# Patient Record
Sex: Female | Born: 1940 | Race: Black or African American | Hispanic: No | Marital: Married | Smoking: Never smoker
Health system: Southern US, Community
[De-identification: ages and names within clinical notes are randomized; demographics above are authoritative.]

## PROBLEM LIST (undated history)

## (undated) DIAGNOSIS — Z923 Personal history of irradiation: Secondary | ICD-10-CM

## (undated) DIAGNOSIS — M1990 Unspecified osteoarthritis, unspecified site: Secondary | ICD-10-CM

## (undated) DIAGNOSIS — C50919 Malignant neoplasm of unspecified site of unspecified female breast: Secondary | ICD-10-CM

## (undated) DIAGNOSIS — D0510 Intraductal carcinoma in situ of unspecified breast: Secondary | ICD-10-CM

## (undated) HISTORY — DX: Unspecified osteoarthritis, unspecified site: M19.90

## (undated) HISTORY — PX: ROTATOR CUFF REPAIR: SHX139

## (undated) HISTORY — PX: CERVICAL LAMINECTOMY: SHX94

## (undated) HISTORY — PX: BUNIONECTOMY: SHX129

## (undated) HISTORY — DX: Intraductal carcinoma in situ of unspecified breast: D05.10

---

## 2009-11-25 ENCOUNTER — Emergency Department: Payer: Self-pay | Admitting: Emergency Medicine

## 2010-12-21 DIAGNOSIS — C50919 Malignant neoplasm of unspecified site of unspecified female breast: Secondary | ICD-10-CM

## 2010-12-21 HISTORY — PX: BREAST LUMPECTOMY: SHX2

## 2010-12-21 HISTORY — PX: BREAST BIOPSY: SHX20

## 2010-12-21 HISTORY — DX: Malignant neoplasm of unspecified site of unspecified female breast: C50.919

## 2010-12-22 ENCOUNTER — Emergency Department: Payer: Self-pay | Admitting: Emergency Medicine

## 2011-05-07 ENCOUNTER — Ambulatory Visit: Payer: Self-pay | Admitting: Orthopedic Surgery

## 2011-09-24 ENCOUNTER — Ambulatory Visit: Payer: Self-pay | Admitting: Surgery

## 2011-10-13 ENCOUNTER — Other Ambulatory Visit: Payer: Self-pay | Admitting: General Surgery

## 2011-10-13 DIAGNOSIS — R928 Other abnormal and inconclusive findings on diagnostic imaging of breast: Secondary | ICD-10-CM

## 2011-10-13 DIAGNOSIS — N63 Unspecified lump in unspecified breast: Secondary | ICD-10-CM

## 2011-10-13 DIAGNOSIS — N6489 Other specified disorders of breast: Secondary | ICD-10-CM

## 2011-10-28 ENCOUNTER — Ambulatory Visit
Admission: RE | Admit: 2011-10-28 | Discharge: 2011-10-28 | Disposition: A | Discharge status: Home / Self Care | Payer: Medicare Other | Source: Ambulatory Visit | Attending: General Surgery | Admitting: General Surgery

## 2011-10-28 DIAGNOSIS — N6489 Other specified disorders of breast: Secondary | ICD-10-CM

## 2011-10-28 DIAGNOSIS — R928 Other abnormal and inconclusive findings on diagnostic imaging of breast: Secondary | ICD-10-CM

## 2011-10-28 DIAGNOSIS — N63 Unspecified lump in unspecified breast: Secondary | ICD-10-CM

## 2011-10-28 MED ORDER — GADOBENATE DIMEGLUMINE 529 MG/ML IV SOLN
15.0000 mL | Freq: Once | INTRAVENOUS | Status: AC | PRN
  Administered 2011-10-28: 15 mL via INTRAVENOUS

## 2011-10-30 ENCOUNTER — Other Ambulatory Visit: Payer: Self-pay | Admitting: General Surgery

## 2011-10-30 DIAGNOSIS — R928 Other abnormal and inconclusive findings on diagnostic imaging of breast: Secondary | ICD-10-CM

## 2011-11-06 ENCOUNTER — Ambulatory Visit
Admission: RE | Admit: 2011-11-06 | Discharge: 2011-11-06 | Disposition: A | Discharge status: Home / Self Care | Payer: Medicare Other | Source: Ambulatory Visit | Attending: General Surgery | Admitting: General Surgery

## 2011-11-06 ENCOUNTER — Other Ambulatory Visit: Payer: Self-pay | Admitting: General Surgery

## 2011-11-06 DIAGNOSIS — R928 Other abnormal and inconclusive findings on diagnostic imaging of breast: Secondary | ICD-10-CM

## 2011-11-06 MED ORDER — GADOBENATE DIMEGLUMINE 529 MG/ML IV SOLN
15.0000 mL | Freq: Once | INTRAVENOUS | Status: AC | PRN
  Administered 2011-11-06: 15 mL via INTRAVENOUS

## 2011-11-10 ENCOUNTER — Ambulatory Visit
Admission: RE | Admit: 2011-11-10 | Discharge: 2011-11-10 | Disposition: A | Discharge status: Home / Self Care | Payer: Medicare Other | Source: Ambulatory Visit | Attending: General Surgery | Admitting: General Surgery

## 2011-11-10 DIAGNOSIS — R928 Other abnormal and inconclusive findings on diagnostic imaging of breast: Secondary | ICD-10-CM

## 2011-11-17 ENCOUNTER — Ambulatory Visit: Payer: Self-pay | Admitting: Surgery

## 2011-11-17 DIAGNOSIS — Z01810 Encounter for preprocedural cardiovascular examination: Secondary | ICD-10-CM

## 2011-11-24 ENCOUNTER — Ambulatory Visit: Payer: Self-pay | Admitting: Surgery

## 2011-12-11 ENCOUNTER — Ambulatory Visit: Payer: Self-pay | Admitting: Oncology

## 2011-12-22 ENCOUNTER — Ambulatory Visit: Payer: Self-pay | Admitting: Oncology

## 2012-01-01 LAB — CBC CANCER CENTER
Eosinophil #: 0.1 x10 3/mm (ref 0.0–0.7)
Eosinophil %: 1.2 %
MCH: 29.5 pg (ref 26.0–34.0)
MCHC: 33.6 g/dL (ref 32.0–36.0)
Monocyte #: 0.4 x10 3/mm (ref 0.0–0.7)
Neutrophil %: 37.9 %
Platelet: 252 x10 3/mm (ref 150–440)
RBC: 4.55 10*6/uL (ref 3.80–5.20)

## 2012-01-15 LAB — CBC CANCER CENTER
Basophil %: 0.8 %
Eosinophil #: 0.1 x10 3/mm (ref 0.0–0.7)
Lymphocyte %: 45.5 %
MCHC: 33.6 g/dL (ref 32.0–36.0)
Monocyte #: 0.4 x10 3/mm (ref 0.0–0.7)
Monocyte %: 11.7 %
Neutrophil #: 1.2 x10 3/mm — ABNORMAL LOW (ref 1.4–6.5)
Neutrophil %: 38.3 %
Platelet: 210 x10 3/mm (ref 150–440)
RDW: 14.5 % (ref 11.5–14.5)
WBC: 3.2 x10 3/mm — ABNORMAL LOW (ref 3.6–11.0)

## 2012-01-22 ENCOUNTER — Ambulatory Visit: Payer: Self-pay | Admitting: Oncology

## 2012-01-22 LAB — CBC CANCER CENTER
Basophil #: 0 x10 3/mm (ref 0.0–0.1)
Eosinophil #: 0 x10 3/mm (ref 0.0–0.7)
Eosinophil %: 1.6 %
HCT: 38.4 % (ref 35.0–47.0)
MCH: 29.8 pg (ref 26.0–34.0)
MCV: 88 fL (ref 80–100)
Monocyte %: 10.6 %
Neutrophil #: 1.5 x10 3/mm (ref 1.4–6.5)
RBC: 4.36 10*6/uL (ref 3.80–5.20)
WBC: 2.9 x10 3/mm — ABNORMAL LOW (ref 3.6–11.0)

## 2012-01-29 LAB — CBC CANCER CENTER
Eosinophil #: 0.1 x10 3/mm (ref 0.0–0.7)
HCT: 39.4 % (ref 35.0–47.0)
Lymphocyte %: 35.7 %
Monocyte #: 0.4 x10 3/mm (ref 0.0–0.7)
Monocyte %: 12.6 %
Neutrophil #: 1.5 x10 3/mm (ref 1.4–6.5)
Platelet: 194 x10 3/mm (ref 150–440)
RBC: 4.48 10*6/uL (ref 3.80–5.20)
RDW: 14.2 % (ref 11.5–14.5)
WBC: 3.1 x10 3/mm — ABNORMAL LOW (ref 3.6–11.0)

## 2012-02-12 LAB — CBC CANCER CENTER
Basophil %: 0.7 %
Eosinophil %: 1.4 %
HGB: 13.2 g/dL (ref 12.0–16.0)
Lymphocyte #: 1 x10 3/mm (ref 1.0–3.6)
Monocyte #: 0.4 x10 3/mm (ref 0.0–0.7)
Monocyte %: 14.2 %
Platelet: 187 x10 3/mm (ref 150–440)
WBC: 2.8 x10 3/mm — ABNORMAL LOW (ref 3.6–11.0)

## 2012-02-19 ENCOUNTER — Ambulatory Visit: Payer: Self-pay | Admitting: Oncology

## 2012-03-21 ENCOUNTER — Ambulatory Visit: Payer: Self-pay | Admitting: Oncology

## 2012-04-20 ENCOUNTER — Ambulatory Visit: Payer: Self-pay | Admitting: Oncology

## 2012-06-28 ENCOUNTER — Ambulatory Visit: Payer: Self-pay | Admitting: Oncology

## 2012-07-21 ENCOUNTER — Ambulatory Visit: Payer: Self-pay | Admitting: Oncology

## 2012-09-23 ENCOUNTER — Ambulatory Visit: Payer: Self-pay | Admitting: Oncology

## 2012-09-27 ENCOUNTER — Ambulatory Visit: Payer: Self-pay | Admitting: Oncology

## 2012-10-21 ENCOUNTER — Ambulatory Visit: Payer: Self-pay | Admitting: Oncology

## 2012-12-21 ENCOUNTER — Ambulatory Visit: Payer: Self-pay | Admitting: Oncology

## 2013-01-03 LAB — CBC CANCER CENTER
Basophil %: 1 %
Eosinophil #: 0.1 x10 3/mm (ref 0.0–0.7)
HGB: 12.9 g/dL (ref 12.0–16.0)
MCH: 29.6 pg (ref 26.0–34.0)
Monocyte #: 0.4 x10 3/mm (ref 0.2–0.9)
Neutrophil #: 1.2 x10 3/mm — ABNORMAL LOW (ref 1.4–6.5)
Platelet: 179 x10 3/mm (ref 150–440)
RDW: 14.3 % (ref 11.5–14.5)

## 2013-01-03 LAB — IRON AND TIBC: Unbound Iron-Bind.Cap.: 205 ug/dL

## 2013-01-03 LAB — FERRITIN: Ferritin (ARMC): 206 ng/mL (ref 8–388)

## 2013-01-21 ENCOUNTER — Ambulatory Visit: Payer: Self-pay | Admitting: Oncology

## 2013-06-20 ENCOUNTER — Ambulatory Visit: Payer: Self-pay | Admitting: Oncology

## 2013-07-21 ENCOUNTER — Ambulatory Visit: Payer: Self-pay | Admitting: Oncology

## 2013-11-02 ENCOUNTER — Ambulatory Visit: Payer: Self-pay | Admitting: Oncology

## 2014-02-28 ENCOUNTER — Ambulatory Visit: Payer: Self-pay | Admitting: Oncology

## 2014-03-21 ENCOUNTER — Ambulatory Visit: Payer: Self-pay | Admitting: Oncology

## 2014-12-24 DIAGNOSIS — M5030 Other cervical disc degeneration, unspecified cervical region: Secondary | ICD-10-CM | POA: Insufficient documentation

## 2015-01-14 ENCOUNTER — Ambulatory Visit: Payer: Self-pay | Admitting: Oncology

## 2015-09-20 ENCOUNTER — Telehealth: Payer: Self-pay | Admitting: *Deleted

## 2015-09-20 NOTE — Telephone Encounter (Signed)
Having pain in right breast where she had surgery in 2012. Patient was a no show for her appt in Sept 2015 and has no fu appt scheduled I called and left message that appt has been scheduled for 10/11 at 315 and to call back if unable to keep that appt

## 2015-10-01 ENCOUNTER — Encounter: Payer: Self-pay | Admitting: Oncology

## 2015-10-01 ENCOUNTER — Inpatient Hospital Stay: Payer: Medicare Other | Attending: Oncology | Admitting: Oncology

## 2015-10-01 VITALS — BP 170/79 | HR 76 | Temp 96.5°F | Resp 18 | Wt 175.0 lb

## 2015-10-01 DIAGNOSIS — Z803 Family history of malignant neoplasm of breast: Secondary | ICD-10-CM | POA: Diagnosis not present

## 2015-10-01 DIAGNOSIS — Z79899 Other long term (current) drug therapy: Secondary | ICD-10-CM

## 2015-10-01 DIAGNOSIS — N644 Mastodynia: Secondary | ICD-10-CM | POA: Diagnosis not present

## 2015-10-01 DIAGNOSIS — Z79810 Long term (current) use of selective estrogen receptor modulators (SERMs): Secondary | ICD-10-CM

## 2015-10-01 DIAGNOSIS — D0511 Intraductal carcinoma in situ of right breast: Secondary | ICD-10-CM | POA: Insufficient documentation

## 2015-10-01 DIAGNOSIS — M1990 Unspecified osteoarthritis, unspecified site: Secondary | ICD-10-CM | POA: Diagnosis not present

## 2015-10-01 MED ORDER — TAMOXIFEN CITRATE 20 MG PO TABS: 20.0000 mg | ORAL_TABLET | Freq: Every day | ORAL | Status: DC

## 2015-10-01 NOTE — Progress Notes (Signed)
For the past month patient has occasional right breast pain with stretching or reaching with her right arm.

## 2015-10-15 NOTE — Progress Notes (Signed)
[place]  Telephone:(336) [phone] Fax:(336) [phone]  ID: ODESSA NISHI OB: 1941/10/30  MR#: [id number]  OQH#:[id number]  No care team member to display  CHIEF COMPLAINT:  Chief Complaint  Patient presents with  . DCIS    INTERVAL HISTORY: Patient returns to clinic today for further evaluation. Patient is complaining of worsening right breast pain particularly with stretching or reaching with her right arm. She continues to tolerate tamoxifen well without significant side effects. She has no recent fevers or illnesses.  She has no neurologic complaints.  She denies any other pain.  She has a good appetite and denies weight loss.  She denies any chest pain or shortness of breath.  She has no nausea, vomiting, constipation, or diarrhea.  She has no urinary complaints.  Patient offers no further specific complaints today.   REVIEW OF SYSTEMS:   Review of Systems  Constitutional: Negative.   Respiratory: Negative.   Cardiovascular: Negative.   Gastrointestinal: Negative.   Musculoskeletal:       Right breast pain.  Neurological: Negative.     As per HPI. Otherwise, a complete review of systems is negatve.  PAST MEDICAL HISTORY: Past Medical History  Diagnosis Date  . DCIS (ductal carcinoma in situ)   . Osteoarthritis     PAST SURGICAL HISTORY: Past Surgical History  Procedure Laterality Date  . Cervical laminectomy    . Rotator cuff repair    . Bunionectomy    . Breast lumpectomy      FAMILY HISTORY Family History  Problem Relation Age of Onset  . Breast cancer Mother 78  . Coronary artery disease Mother   . Hypertension Mother        ADVANCED DIRECTIVES:    HEALTH MAINTENANCE: Social History  Substance Use Topics  . Smoking status: Not on file  . Smokeless tobacco: Not on file  . Alcohol Use: Not on file     Colonoscopy:  PAP:  Bone density:  Lipid panel:  Allergies  Allergen Reactions  . Quinine Hives    Current  Outpatient Prescriptions  Medication Sig Dispense Refill  . Biotin 1000 MCG tablet Take by mouth.    . calcium carbonate (TUMS - DOSED IN MG ELEMENTAL CALCIUM) 500 MG chewable tablet Chew by mouth.    . CO ENZYME Q-10 PO Take by mouth.    . Cyanocobalamin (VITAMIN B 12 PO) Take by mouth.    . latanoprost (XALATAN) 0.005 % ophthalmic solution Place 1 drop into both eyes at bedtime.  5  . Red Yeast Rice 600 MG TABS Take by mouth.    Marland Kitchen SIMBRINZA 1-0.2 % SUSP INSTILL 1 DROP INTO BOTH EYES TWICE A DAY  5  . tamoxifen (NOLVADEX) 20 MG tablet Take 1 tablet (20 mg total) by mouth daily. 30 tablet 11  . vitamin C (ASCORBIC ACID) 500 MG tablet Take by mouth.     No current facility-administered medications for this visit.    OBJECTIVE: Filed Vitals:   10/01/15 1544  BP: 170/79  Pulse: 76  Temp: 96.5 F (35.8 C)  Resp: 18     There is no height on file to calculate BMI.    ECOG FS:0 - Asymptomatic  General: Well-developed, well-nourished, no acute distress. Eyes: Pink conjunctiva, anicteric sclera. Breasts: Bilateral breast and axilla without lumps or masses. Right breast with well-healed surgical scar. Lungs: Clear to auscultation bilaterally. Heart: Regular rate and rhythm. No rubs, murmurs, or gallops. Abdomen: Soft, nontender, nondistended. No organomegaly noted,  normoactive bowel sounds. Musculoskeletal: No edema, cyanosis, or clubbing. Neuro: Alert, answering all questions appropriately. Cranial nerves grossly intact. Skin: No rashes or petechiae noted. Psych: Normal affect.   LAB RESULTS:  No results found for: NA, K, CL, CO2, GLUCOSE, BUN, CREATININE, CALCIUM, PROT, ALBUMIN, AST, ALT, ALKPHOS, BILITOT, GFRNONAA, GFRAA  Lab Results  Component Value Date   WBC 2.9* 01/03/2013   NEUTROABS 1.2* 01/03/2013   HGB 12.9 01/03/2013   HCT 38.4 01/03/2013   MCV 88 01/03/2013   PLT 179 01/03/2013     STUDIES: No results found.  ASSESSMENT: DCIS  PLAN:    1.  DCIS: No  evidence of disease.  Her most recent mammogram on January 14, 2015 was reported as BI-RADS 2.  Repeat in January 2017  Continue tamoxifen completing in May 2018.  Return to clinic in 6 months for routine evaluation.  2. Breast pain: Unclear etiology. No distinct masses or pathology palpated. Possibly related to underlying scar tissue. Monitor.  Patient expressed understanding and was in agreement with this plan. She also understands that She can call clinic at any time with any questions, concerns, or complaints.    Lloyd Huger, MD   10/15/2015 11:04 PM

## 2016-01-16 ENCOUNTER — Other Ambulatory Visit: Payer: Medicare Other

## 2016-01-16 ENCOUNTER — Ambulatory Visit: Payer: Medicare Other

## 2016-01-29 ENCOUNTER — Other Ambulatory Visit: Payer: Self-pay | Admitting: Oncology

## 2016-01-29 ENCOUNTER — Ambulatory Visit
Admission: RE | Admit: 2016-01-29 | Discharge: 2016-01-29 | Disposition: A | Discharge status: Home / Self Care | Payer: Medicare Other | Source: Ambulatory Visit | Attending: Oncology | Admitting: Oncology

## 2016-01-29 DIAGNOSIS — D0511 Intraductal carcinoma in situ of right breast: Secondary | ICD-10-CM

## 2016-01-29 HISTORY — DX: Malignant neoplasm of unspecified site of unspecified female breast: C50.919

## 2016-01-29 NOTE — Progress Notes (Signed)
Rapid Response called for patient. Patient feeling clammy and diaphoretic. Pt was laid down by tech and legs elevated and pt was drinking diet ginger ale. Pt was feeling better at the time this RN arrived. BP was 120/70's and HR in the 70's. Blood sugar was 83 after she had already drank some ginger ale. Pt stated she hadn't had much to eat today. Pt advised to continue to drink fluids and try to eat something to maintain blood sugar. Pt BP prior to this RN leaving was 152/72 and HR 71. Crewe Heathman E 3:12 PM 01/29/2016

## 2016-03-25 ENCOUNTER — Ambulatory Visit: Admission: RE | Admit: 2016-03-25 | Payer: Medicare Other | Source: Ambulatory Visit

## 2016-03-25 ENCOUNTER — Ambulatory Visit
Admission: RE | Admit: 2016-03-25 | Discharge: 2016-03-25 | Disposition: A | Discharge status: Home / Self Care | Payer: Medicare Other | Source: Ambulatory Visit | Attending: Oncology | Admitting: Oncology

## 2016-03-25 DIAGNOSIS — Z853 Personal history of malignant neoplasm of breast: Secondary | ICD-10-CM | POA: Insufficient documentation

## 2016-04-07 ENCOUNTER — Inpatient Hospital Stay: Payer: Medicare Other | Admitting: Oncology

## 2016-04-08 ENCOUNTER — Inpatient Hospital Stay: Payer: Medicare Other | Attending: Oncology | Admitting: Oncology

## 2016-04-08 VITALS — BP 155/84 | HR 99 | Temp 96.0°F | Resp 18 | Wt 173.7 lb

## 2016-04-08 DIAGNOSIS — D0511 Intraductal carcinoma in situ of right breast: Secondary | ICD-10-CM | POA: Insufficient documentation

## 2016-04-08 DIAGNOSIS — Z803 Family history of malignant neoplasm of breast: Secondary | ICD-10-CM | POA: Insufficient documentation

## 2016-04-08 DIAGNOSIS — M1990 Unspecified osteoarthritis, unspecified site: Secondary | ICD-10-CM | POA: Insufficient documentation

## 2016-04-08 DIAGNOSIS — I10 Essential (primary) hypertension: Secondary | ICD-10-CM | POA: Diagnosis not present

## 2016-04-08 DIAGNOSIS — Z170 Estrogen receptor positive status [ER+]: Secondary | ICD-10-CM | POA: Insufficient documentation

## 2016-04-08 DIAGNOSIS — R5381 Other malaise: Secondary | ICD-10-CM | POA: Insufficient documentation

## 2016-04-08 DIAGNOSIS — R5383 Other fatigue: Secondary | ICD-10-CM | POA: Insufficient documentation

## 2016-04-08 DIAGNOSIS — Z79810 Long term (current) use of selective estrogen receptor modulators (SERMs): Secondary | ICD-10-CM | POA: Insufficient documentation

## 2016-04-08 DIAGNOSIS — Z79899 Other long term (current) drug therapy: Secondary | ICD-10-CM | POA: Insufficient documentation

## 2016-04-08 NOTE — Progress Notes (Signed)
Patient feels she gets fatigued more easily.

## 2016-04-08 NOTE — Progress Notes (Signed)
[place]  Telephone:(336) [phone] Fax:(336) [phone]  ID: Olivia Burton OB: 1941-09-27  MR#: [id number]  [id number]  No care team member to display  CHIEF COMPLAINT:  Chief Complaint  Patient presents with  . DCIS    INTERVAL HISTORY: Patient returns to clinic today for further evaluation. Patient feels well today. Her only complaint is fatigue which she attributes to 'taking on too much responsibility at church".  She continues to tolerate tamoxifen well without significant side effects. She has no recent fevers or illnesses.  She has no neurologic complaints.  She denies any other pain.  She has a good appetite and denies weight loss.  She denies any chest pain or shortness of breath.  She has no nausea, vomiting, constipation, or diarrhea.  She has no urinary complaints.  Patient offers no further specific complaints today.   REVIEW OF SYSTEMS:   Review of Systems  Constitutional: Positive for malaise/fatigue.  Respiratory: Negative.   Cardiovascular: Negative.   Gastrointestinal: Negative.   Musculoskeletal:       Right breast pain.  Neurological: Negative.     As per HPI. Otherwise, a complete review of systems is negatve.  PAST MEDICAL HISTORY: Past Medical History  Diagnosis Date  . DCIS (ductal carcinoma in situ)   . Osteoarthritis   . Breast cancer [facility]) 2012    Right breast    PAST SURGICAL HISTORY: Past Surgical History  Procedure Laterality Date  . Cervical laminectomy    . Rotator cuff repair    . Bunionectomy    . Breast lumpectomy Right 2012    FAMILY HISTORY Family History  Problem Relation Age of Onset  . Breast cancer Mother 34  . Coronary artery disease Mother   . Hypertension Mother        ADVANCED DIRECTIVES:    HEALTH MAINTENANCE: Social History  Substance Use Topics  . Smoking status: Not on file  . Smokeless tobacco: Not on file  . Alcohol Use: Not on file    Allergies  Allergen Reactions  .  Quinine Hives    Current Outpatient Prescriptions  Medication Sig Dispense Refill  . Biotin 1000 MCG tablet Take by mouth.    . calcium carbonate (TUMS - DOSED IN MG ELEMENTAL CALCIUM) 500 MG chewable tablet Chew by mouth.    . CO ENZYME Q-10 PO Take by mouth.    . Cyanocobalamin (VITAMIN B 12 PO) Take by mouth.    . latanoprost (XALATAN) 0.005 % ophthalmic solution Place 1 drop into both eyes at bedtime.  5  . Red Yeast Rice 600 MG TABS Take by mouth.    Marland Kitchen SIMBRINZA 1-0.2 % SUSP INSTILL 1 DROP INTO BOTH EYES TWICE A DAY  5  . tamoxifen (NOLVADEX) 20 MG tablet Take 1 tablet (20 mg total) by mouth daily. 30 tablet 11  . vitamin C (ASCORBIC ACID) 500 MG tablet Take by mouth.     No current facility-administered medications for this visit.    OBJECTIVE: Filed Vitals:   04/08/16 1005  BP: 155/84  Pulse: 99  Temp: 96 F (35.6 C)  Resp: 18     There is no height on file to calculate BMI.    ECOG FS:0 - Asymptomatic  General: Well-developed, well-nourished, no acute distress. Eyes: Pink conjunctiva, anicteric sclera. Breasts: Bilateral breast and axilla without lumps or masses. Right breast with well-healed surgical scar. Lungs: Clear to auscultation bilaterally. Heart: Regular rate and rhythm. No rubs, murmurs, or gallops. Abdomen: Soft,  nontender, nondistended. No organomegaly noted, normoactive bowel sounds. Musculoskeletal: No edema, cyanosis, or clubbing. Neuro: Alert, answering all questions appropriately. Cranial nerves grossly intact. Skin: No rashes or petechiae noted. Psych: Normal affect.   LAB RESULTS:  No results found for: NA, K, CL, CO2, GLUCOSE, BUN, CREATININE, CALCIUM, PROT, ALBUMIN, AST, ALT, ALKPHOS, BILITOT, GFRNONAA, GFRAA  Lab Results  Component Value Date   WBC 2.9* 01/03/2013   NEUTROABS 1.2* 01/03/2013   HGB 12.9 01/03/2013   HCT 38.4 01/03/2013   MCV 88 01/03/2013   PLT 179 01/03/2013     STUDIES: Mm Diag Breast Tomo Bilateral  03/25/2016   CLINICAL DATA:  Personal history of right breast cancer status post lumpectomy 2012 EXAM: 2D DIGITAL DIAGNOSTIC BILATERAL MAMMOGRAM WITH CAD AND ADJUNCT TOMO COMPARISON:  Previous exam(s). ACR Breast Density Category b: There are scattered areas of fibroglandular density. FINDINGS: Cc and MLO views of bilateral breasts, spot tangential view of right breast are submitted. Postsurgical changes are identified in the right breast. No suspicious abnormalities identified bilaterally. Mammographic images were processed with CAD. IMPRESSION: Benign findings. RECOMMENDATION: Bilateral diagnostic mammogram in 1 year. I have discussed the findings and recommendations with the patient. Results were also provided in writing at the conclusion of the visit. If applicable, a reminder letter will be sent to the patient regarding the next appointment. BI-RADS CATEGORY  2: Benign. Electronically Signed   By: Abelardo Diesel M.D.   On: 03/25/2016 14:54    ASSESSMENT: DCIS  PLAN:    1.  DCIS: No evidence of disease.  Her most recent mammogram on March 25, 2016 was reported as BI-RADS 2.  Repeat in April 2018  Continue tamoxifen completing in May 2018.  Return to clinic in 6 months for routine evaluation.  2. Hypertension: Patient's blood pressure is mildly elevated today. Will defer treatment per primary care.   Patient expressed understanding and was in agreement with this plan. She also understands that She can call clinic at any time with any questions, concerns, or complaints.    Mayra Reel, NP   04/08/2016 10:53 AM  Patient was seen and evaluated independently and I agree with the assessment and plan as dictated above.  Lloyd Huger, MD 04/08/2016 4:45 PM

## 2016-10-07 DIAGNOSIS — D0511 Intraductal carcinoma in situ of right breast: Secondary | ICD-10-CM | POA: Insufficient documentation

## 2016-10-07 NOTE — Progress Notes (Signed)
[place]  Telephone:(336) [phone] Fax:(336) [phone]  ID: Olivia Burton OB: [birth year]  MR#: [id number]  [id number]  No care team member to display  CHIEF COMPLAINT: DCIS of the right breast.  INTERVAL HISTORY: Patient returns to clinic today for routine six-month evaluation. She currently feels well and is asymptomatic. She continues to tolerate tamoxifen well without significant side effects. She has no recent fevers or illnesses. She has no neurologic complaints. She denies any pain. She has a good appetite and denies weight loss.  She denies any chest pain or shortness of breath.  She has no nausea, vomiting, constipation, or diarrhea.  She has no urinary complaints.  Patient offers no specific complaints today.   REVIEW OF SYSTEMS:   Review of Systems  Constitutional: Negative for fever, malaise/fatigue and weight loss.  Respiratory: Negative.  Negative for cough and shortness of breath.   Cardiovascular: Negative.  Negative for chest pain and leg swelling.  Gastrointestinal: Negative.   Genitourinary: Negative.   Musculoskeletal: Negative.   Neurological: Negative.  Negative for sensory change and weakness.  Psychiatric/Behavioral: Negative.  The patient is not nervous/anxious.     As per HPI. Otherwise, a complete review of systems is negative.  PAST MEDICAL HISTORY: Past Medical History:  Diagnosis Date  . Breast cancer ([place]) 2012   Right breast  . DCIS (ductal carcinoma in situ)   . Osteoarthritis     PAST SURGICAL HISTORY: Past Surgical History:  Procedure Laterality Date  . BREAST LUMPECTOMY Right 2012  . BUNIONECTOMY    . CERVICAL LAMINECTOMY    . ROTATOR CUFF REPAIR      FAMILY HISTORY Family History  Problem Relation Age of Onset  . Breast cancer Mother 68  . Coronary artery disease Mother   . Hypertension Mother        ADVANCED DIRECTIVES:    HEALTH MAINTENANCE: Social History  Substance Use Topics  . Smoking  status: Not on file  . Smokeless tobacco: Not on file  . Alcohol use Not on file    Allergies  Allergen Reactions  . Quinine Hives    Current Outpatient Prescriptions  Medication Sig Dispense Refill  . Biotin 1000 MCG tablet Take by mouth.    . calcium carbonate (TUMS - DOSED IN MG ELEMENTAL CALCIUM) 500 MG chewable tablet Chew by mouth.    . CO ENZYME Q-10 PO Take by mouth.    . Cyanocobalamin (VITAMIN B 12 PO) Take by mouth.    . latanoprost (XALATAN) 0.005 % ophthalmic solution Place 1 drop into both eyes at bedtime.  5  . Red Yeast Rice 600 MG TABS Take by mouth.    Marland Kitchen SIMBRINZA 1-0.2 % SUSP INSTILL 1 DROP INTO BOTH EYES TWICE A DAY  5  . tamoxifen (NOLVADEX) 20 MG tablet Take 1 tablet (20 mg total) by mouth daily. 30 tablet 11  . vitamin C (ASCORBIC ACID) 500 MG tablet Take by mouth.     No current facility-administered medications for this visit.     OBJECTIVE: Vitals:   10/08/16 1059  BP: 138/81  Pulse: 82  Resp: 18  Temp: 98.7 F (37.1 C)     There is no height or weight on file to calculate BMI.    ECOG FS:0 - Asymptomatic  General: Well-developed, well-nourished, no acute distress. Eyes: Pink conjunctiva, anicteric sclera. Breasts: Bilateral breast and axilla without lumps or masses. Right breast with well-healed surgical scar. Lungs: Clear to auscultation bilaterally. Heart: Regular rate and  rhythm. No rubs, murmurs, or gallops. Abdomen: Soft, nontender, nondistended. No organomegaly noted, normoactive bowel sounds. Musculoskeletal: No edema, cyanosis, or clubbing. Neuro: Alert, answering all questions appropriately. Cranial nerves grossly intact. Skin: No rashes or petechiae noted. Psych: Normal affect.   LAB RESULTS:  No results found for: NA, K, CL, CO2, GLUCOSE, BUN, CREATININE, CALCIUM, PROT, ALBUMIN, AST, ALT, ALKPHOS, BILITOT, GFRNONAA, GFRAA  Lab Results  Component Value Date   WBC 2.9 (L) 01/03/2013   NEUTROABS 1.2 (L) 01/03/2013   HGB 12.9  01/03/2013   HCT 38.4 01/03/2013   MCV 88 01/03/2013   PLT 179 01/03/2013     STUDIES: No results found.  ASSESSMENT: DCIS of the right breast  PLAN:    1. DCIS of the right breast: No evidence of disease.  Her most recent mammogram on March 25, 2016 was reported as BI-RADS 2.  Repeat in April 2018  Continue tamoxifen for 5 years completing in May 2018.  Return to clinic in 6 months for routine evaluation.  2. Hypertension: Improved. Continue evaluation and treatment per primary care.   Patient expressed understanding and was in agreement with this plan. She also understands that She can call clinic at any time with any questions, concerns, or complaints.    Lloyd Huger, MD   10/15/2016 5:18 PM

## 2016-10-08 ENCOUNTER — Inpatient Hospital Stay: Payer: Medicare Other | Attending: Oncology | Admitting: Oncology

## 2016-10-08 VITALS — BP 138/81 | HR 82 | Temp 98.7°F | Resp 18 | Wt 172.8 lb

## 2016-10-08 DIAGNOSIS — I10 Essential (primary) hypertension: Secondary | ICD-10-CM | POA: Diagnosis not present

## 2016-10-08 DIAGNOSIS — Z803 Family history of malignant neoplasm of breast: Secondary | ICD-10-CM | POA: Diagnosis not present

## 2016-10-08 DIAGNOSIS — D0511 Intraductal carcinoma in situ of right breast: Secondary | ICD-10-CM

## 2016-10-08 DIAGNOSIS — M1990 Unspecified osteoarthritis, unspecified site: Secondary | ICD-10-CM | POA: Diagnosis not present

## 2016-10-08 DIAGNOSIS — Z79810 Long term (current) use of selective estrogen receptor modulators (SERMs): Secondary | ICD-10-CM

## 2016-10-08 DIAGNOSIS — Z79899 Other long term (current) drug therapy: Secondary | ICD-10-CM

## 2016-10-08 DIAGNOSIS — Z170 Estrogen receptor positive status [ER+]: Secondary | ICD-10-CM | POA: Diagnosis not present

## 2016-10-08 NOTE — Progress Notes (Signed)
States has occasional muscle cramps from tamoxifen. Occasional will hold tamoxifen due to muscle cramps but then will restart.

## 2017-01-29 DIAGNOSIS — I4891 Unspecified atrial fibrillation: Secondary | ICD-10-CM | POA: Insufficient documentation

## 2017-03-29 ENCOUNTER — Ambulatory Visit
Admission: RE | Admit: 2017-03-29 | Discharge: 2017-03-29 | Disposition: A | Discharge status: Home / Self Care | Payer: Medicare Other | Source: Ambulatory Visit | Attending: Oncology | Admitting: Oncology

## 2017-03-29 DIAGNOSIS — D0511 Intraductal carcinoma in situ of right breast: Secondary | ICD-10-CM | POA: Diagnosis not present

## 2017-03-29 DIAGNOSIS — Z0000 Encounter for general adult medical examination without abnormal findings: Secondary | ICD-10-CM | POA: Diagnosis not present

## 2017-03-29 HISTORY — DX: Personal history of irradiation: Z92.3

## 2017-04-05 ENCOUNTER — Inpatient Hospital Stay: Payer: Medicare Other | Admitting: Oncology

## 2017-04-05 NOTE — Progress Notes (Deleted)
[place] [facility]  Telephone:([phone] Fax:(336) [phone]  ID: Olivia Burton OB: 11/30/1941  MR#: [id number]  NID#:[id number]  No care team member to display  CHIEF COMPLAINT: DCIS of the right breast.  INTERVAL HISTORY: Patient returns to clinic today for routine six-month evaluation. She currently feels well and is asymptomatic. She continues to tolerate tamoxifen well without significant side effects. She has no recent fevers or illnesses. She has no neurologic complaints. She denies any pain. She has a good appetite and denies weight loss.  She denies any chest pain or shortness of breath.  She has no nausea, vomiting, constipation, or diarrhea.  She has no urinary complaints.  Patient offers no specific complaints today.   REVIEW OF SYSTEMS:   Review of Systems  Constitutional: Negative for fever, malaise/fatigue and weight loss.  Respiratory: Negative.  Negative for cough and shortness of breath.   Cardiovascular: Negative.  Negative for chest pain and leg swelling.  Gastrointestinal: Negative.   Genitourinary: Negative.   Musculoskeletal: Negative.   Neurological: Negative.  Negative for sensory change and weakness.  Psychiatric/Behavioral: Negative.  The patient is not nervous/anxious.     As per HPI. Otherwise, a complete review of systems is negative.  PAST MEDICAL HISTORY: Past Medical History:  Diagnosis Date  . Breast cancer ([place]) 2012   Right breast  . DCIS (ductal carcinoma in situ)   . Osteoarthritis   . Personal history of radiation therapy     PAST SURGICAL HISTORY: Past Surgical History:  Procedure Laterality Date  . BREAST BIOPSY Right 2012   +  . BREAST LUMPECTOMY Right 2012  . BUNIONECTOMY    . CERVICAL LAMINECTOMY    . ROTATOR CUFF REPAIR      FAMILY HISTORY Family History  Problem Relation Age of Onset  . Breast cancer Mother 53  . Coronary artery disease Mother   . Hypertension Mother        ADVANCED DIRECTIVES:     HEALTH MAINTENANCE: Social History  Substance Use Topics  . Smoking status: Not on file  . Smokeless tobacco: Not on file  . Alcohol use Not on file    Allergies  Allergen Reactions  . Quinine Hives    Current Outpatient Prescriptions  Medication Sig Dispense Refill  . Biotin 1000 MCG tablet Take by mouth.    . calcium carbonate (TUMS - DOSED IN MG ELEMENTAL CALCIUM) 500 MG chewable tablet Chew by mouth.    . CO ENZYME Q-10 PO Take by mouth.    . Cyanocobalamin (VITAMIN B 12 PO) Take by mouth.    . latanoprost (XALATAN) 0.005 % ophthalmic solution Place 1 drop into both eyes at bedtime.  5  . Red Yeast Rice 600 MG TABS Take by mouth.    Marland Kitchen SIMBRINZA 1-0.2 % SUSP INSTILL 1 DROP INTO BOTH EYES TWICE A DAY  5  . tamoxifen (NOLVADEX) 20 MG tablet Take 1 tablet (20 mg total) by mouth daily. 30 tablet 11  . vitamin C (ASCORBIC ACID) 500 MG tablet Take by mouth.     No current facility-administered medications for this visit.     OBJECTIVE: There were no vitals filed for this visit.   There is no height or weight on file to calculate BMI.    ECOG FS:0 - Asymptomatic  General: Well-developed, well-nourished, no acute distress. Eyes: Pink conjunctiva, anicteric sclera. Breasts: Bilateral breast and axilla without lumps or masses. Right breast with well-healed surgical scar. Lungs: Clear to auscultation bilaterally. Heart:  Regular rate and rhythm. No rubs, murmurs, or gallops. Abdomen: Soft, nontender, nondistended. No organomegaly noted, normoactive bowel sounds. Musculoskeletal: No edema, cyanosis, or clubbing. Neuro: Alert, answering all questions appropriately. Cranial nerves grossly intact. Skin: No rashes or petechiae noted. Psych: Normal affect.   LAB RESULTS:  No results found for: NA, K, CL, CO2, GLUCOSE, BUN, CREATININE, CALCIUM, PROT, ALBUMIN, AST, ALT, ALKPHOS, BILITOT, GFRNONAA, GFRAA  Lab Results  Component Value Date   WBC 2.9 (L) 01/03/2013   NEUTROABS 1.2  (L) 01/03/2013   HGB 12.9 01/03/2013   HCT 38.4 01/03/2013   MCV 88 01/03/2013   PLT 179 01/03/2013     STUDIES: Mm Diag Breast Tomo Bilateral  Result Date: 03/29/2017 CLINICAL DATA:  Right lumpectomy.  Annual mammography. EXAM: 2D DIGITAL DIAGNOSTIC BILATERAL MAMMOGRAM WITH CAD AND ADJUNCT TOMO COMPARISON:  Previous exam(s). ACR Breast Density Category b: There are scattered areas of fibroglandular density. FINDINGS: The right lumpectomy site is stable. No suspicious masses, calcifications, or distortion Mammographic images were processed with CAD. IMPRESSION: No mammographic evidence of malignancy. RECOMMENDATION: Annual diagnostic mammography. I have discussed the findings and recommendations with the patient. Results were also provided in writing at the conclusion of the visit. If applicable, a reminder letter will be sent to the patient regarding the next appointment. BI-RADS CATEGORY  2: Benign. Electronically Signed   By: Dorise Bullion III M.D   On: 03/29/2017 11:44    ASSESSMENT: DCIS of the right breast  PLAN:    1. DCIS of the right breast: No evidence of disease.  Her most recent mammogram on March 25, 2016 was reported as BI-RADS 2.  Repeat in April 2018  Continue tamoxifen for 5 years completing in May 2018.  Return to clinic in 6 months for routine evaluation.  2. Hypertension: Improved. Continue evaluation and treatment per primary care.   Patient expressed understanding and was in agreement with this plan. She also understands that She can call clinic at any time with any questions, concerns, or complaints.    Lloyd Huger, MD   04/05/2017 12:00 AM

## 2017-04-13 NOTE — Progress Notes (Signed)
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  INTERVAL HISTORY: Patient returns to clinic today for routine six-month evaluation. She currently feels well and is asymptomatic. She admits to noncompliance to tamoxifen secondary to cramping and only has been taking it every couple days. She has no recent fevers or illnesses. She has no neurologic complaints. She denies any pain. She has a good appetite and denies weight loss.  She denies any chest pain or shortness of breath.  She has no nausea, vomiting, constipation, or diarrhea.  She has no urinary complaints.  Patient offers no specific complaints today.   REVIEW OF SYSTEMS:   Review of Systems  Constitutional: Negative for fever, malaise/fatigue and weight loss.  Respiratory: Negative.  Negative for cough and shortness of breath.   Cardiovascular: Negative.  Negative for chest pain and leg swelling.  Gastrointestinal: Negative.  Negative for abdominal pain.  Genitourinary: Negative.   Musculoskeletal: Negative.   Skin: Negative.  Negative for rash.  Neurological: Negative.  Negative for sensory change and weakness.  Psychiatric/Behavioral: Negative.  The patient is not nervous/anxious.     As per HPI. Otherwise, a complete review of systems is negative.  PAST MEDICAL HISTORY: Past Medical History:  Diagnosis Date  . Breast cancer ([place]) 2012   Right breast  . DCIS (ductal carcinoma in situ)   . Osteoarthritis   . Personal history of radiation therapy     PAST SURGICAL HISTORY: Past Surgical History:  Procedure Laterality Date  . BREAST BIOPSY Right 2012   +  . BREAST LUMPECTOMY Right 2012  . BUNIONECTOMY    . CERVICAL LAMINECTOMY    . ROTATOR CUFF REPAIR      FAMILY HISTORY Family History  Problem  Relation Age of Onset  . Breast cancer Mother 62  . Coronary artery disease Mother   . Hypertension Mother        ADVANCED DIRECTIVES:    HEALTH MAINTENANCE: Social History  Substance Use Topics  . Smoking status: Not on file  . Smokeless tobacco: Not on file  . Alcohol use Not on file    Allergies  Allergen Reactions  . Quinine Hives    Current Outpatient Prescriptions  Medication Sig Dispense Refill  . Biotin 1000 MCG tablet Take by mouth.    . calcium carbonate (TUMS - DOSED IN MG ELEMENTAL CALCIUM) 500 MG chewable tablet Chew by mouth.    . CO ENZYME Q-10 PO Take by mouth.    . Cyanocobalamin (VITAMIN B 12 PO) Take by mouth.    . latanoprost (XALATAN) 0.005 % ophthalmic solution Place 1 drop into both eyes at bedtime.  5  . Red Yeast Rice 600 MG TABS Take by mouth.    Marland Kitchen SIMBRINZA 1-0.2 % SUSP INSTILL 1 DROP INTO BOTH EYES TWICE A DAY  5  . tamoxifen (NOLVADEX) 20 MG tablet Take 1 tablet (20 mg total) by mouth daily. 30 tablet 11  . vitamin C (ASCORBIC ACID) 500 MG tablet Take by mouth.     No current facility-administered medications for this visit.     OBJECTIVE: Vitals:   04/14/17 0928  BP: 133/86  Pulse: 96  Resp: 18  Temp: 97.9 F (36.6 C)     There is no height or weight on file to calculate BMI.  ECOG FS:0 - Asymptomatic  General: Well-developed, well-nourished, no acute distress. Eyes: Pink conjunctiva, anicteric sclera. Breasts: Bilateral breast and axilla without lumps or masses. Right breast with well-healed surgical scar. Patient declined breast exam today. Lungs: Clear to auscultation bilaterally. Heart: Regular rate and rhythm. No rubs, murmurs, or gallops. Abdomen: Soft, nontender, nondistended. No organomegaly noted, normoactive bowel sounds. Musculoskeletal: No edema, cyanosis, or clubbing. Neuro: Alert, answering all questions appropriately. Cranial nerves grossly intact. Skin: No rashes or petechiae noted. Psych: Normal affect.   LAB  RESULTS:  No results found for: NA, K, CL, CO2, GLUCOSE, BUN, CREATININE, CALCIUM, PROT, ALBUMIN, AST, ALT, ALKPHOS, BILITOT, GFRNONAA, GFRAA  Lab Results  Component Value Date   WBC 2.9 (L) 01/03/2013   NEUTROABS 1.2 (L) 01/03/2013   HGB 12.9 01/03/2013   HCT 38.4 01/03/2013   MCV 88 01/03/2013   PLT 179 01/03/2013     STUDIES: Mm Diag Breast Tomo Bilateral  Result Date: 03/29/2017 CLINICAL DATA:  Right lumpectomy.  Annual mammography. EXAM: 2D DIGITAL DIAGNOSTIC BILATERAL MAMMOGRAM WITH CAD AND ADJUNCT TOMO COMPARISON:  Previous exam(s). ACR Breast Density Category b: There are scattered areas of fibroglandular density. FINDINGS: The right lumpectomy site is stable. No suspicious masses, calcifications, or distortion Mammographic images were processed with CAD. IMPRESSION: No mammographic evidence of malignancy. RECOMMENDATION: Annual diagnostic mammography. I have discussed the findings and recommendations with the patient. Results were also provided in writing at the conclusion of the visit. If applicable, a reminder letter will be sent to the patient regarding the next appointment. BI-RADS CATEGORY  2: Benign. Electronically Signed   By: Dorise Bullion III M.D   On: 03/29/2017 11:44    ASSESSMENT: DCIS of the right breast  PLAN:    1. DCIS of the right breast: No evidence of disease.  Her most recent mammogram on March 29, 2017 was reported as BI-RADS 2.  Repeat in April 2019. Patient has now completed 5 years of tamoxifen and has been instructed to discontinue treatment. Return to clinic in 1 year for routine evaluation at which time patient can likely be discharged from clinic.   2. Hypertension: Patient's blood pressure is within normal limits today. Continue evaluation and treatment per primary care.   Patient expressed understanding and was in agreement with this plan. She also understands that She can call clinic at any time with any questions, concerns, or complaints.     Lloyd Huger, MD   04/16/2017 4:06 PM

## 2017-04-14 ENCOUNTER — Inpatient Hospital Stay: Payer: Medicare Other | Attending: Oncology | Admitting: Oncology

## 2017-04-14 VITALS — BP 133/86 | HR 96 | Temp 97.9°F | Resp 18 | Wt 175.8 lb

## 2017-04-14 DIAGNOSIS — Z803 Family history of malignant neoplasm of breast: Secondary | ICD-10-CM | POA: Diagnosis not present

## 2017-04-14 DIAGNOSIS — Z9119 Patient's noncompliance with other medical treatment and regimen: Secondary | ICD-10-CM

## 2017-04-14 DIAGNOSIS — M1990 Unspecified osteoarthritis, unspecified site: Secondary | ICD-10-CM | POA: Diagnosis not present

## 2017-04-14 DIAGNOSIS — D0511 Intraductal carcinoma in situ of right breast: Secondary | ICD-10-CM

## 2017-04-14 DIAGNOSIS — Z79810 Long term (current) use of selective estrogen receptor modulators (SERMs): Secondary | ICD-10-CM

## 2017-04-14 DIAGNOSIS — I10 Essential (primary) hypertension: Secondary | ICD-10-CM | POA: Insufficient documentation

## 2017-04-14 DIAGNOSIS — Z170 Estrogen receptor positive status [ER+]: Secondary | ICD-10-CM | POA: Insufficient documentation

## 2017-04-14 DIAGNOSIS — Z79899 Other long term (current) drug therapy: Secondary | ICD-10-CM | POA: Diagnosis not present

## 2017-04-14 NOTE — Progress Notes (Signed)
Patient is here for follow up. She mentions some pain in her breast when she bends down sometimes. She has not really been taking it. She states her tamoxifen was causing cramping.

## 2018-03-31 ENCOUNTER — Ambulatory Visit
Admission: RE | Admit: 2018-03-31 | Discharge: 2018-03-31 | Disposition: A | Discharge status: Home / Self Care | Payer: Medicare Other | Source: Ambulatory Visit | Attending: Oncology | Admitting: Oncology

## 2018-03-31 DIAGNOSIS — Z853 Personal history of malignant neoplasm of breast: Secondary | ICD-10-CM | POA: Diagnosis not present

## 2018-03-31 DIAGNOSIS — D0511 Intraductal carcinoma in situ of right breast: Secondary | ICD-10-CM

## 2018-04-03 ENCOUNTER — Emergency Department
Admission: EM | Admit: 2018-04-03 | Discharge: 2018-04-03 | Disposition: A | Discharge status: Home / Self Care | Payer: Medicare Other | Attending: Emergency Medicine | Admitting: Emergency Medicine

## 2018-04-03 ENCOUNTER — Emergency Department: Payer: Medicare Other

## 2018-04-03 ENCOUNTER — Encounter: Payer: Self-pay | Admitting: Emergency Medicine

## 2018-04-03 DIAGNOSIS — Z79899 Other long term (current) drug therapy: Secondary | ICD-10-CM | POA: Diagnosis not present

## 2018-04-03 DIAGNOSIS — Z853 Personal history of malignant neoplasm of breast: Secondary | ICD-10-CM | POA: Diagnosis not present

## 2018-04-03 DIAGNOSIS — I482 Chronic atrial fibrillation, unspecified: Secondary | ICD-10-CM

## 2018-04-03 DIAGNOSIS — I4891 Unspecified atrial fibrillation: Secondary | ICD-10-CM | POA: Diagnosis present

## 2018-04-03 LAB — BASIC METABOLIC PANEL
ANION GAP: 6 (ref 5–15)
BUN: 13 mg/dL (ref 6–20)
CALCIUM: 8.9 mg/dL (ref 8.9–10.3)
CO2: 28 mmol/L (ref 22–32)
Chloride: 108 mmol/L (ref 101–111)
Creatinine, Ser: 0.81 mg/dL (ref 0.44–1.00)
GFR calc Af Amer: >60 mL/min (ref >60)
GFR calc non Af Amer: >60 mL/min (ref >60)
Glucose, Bld: 93 mg/dL (ref 65–99)
POTASSIUM: 3.9 mmol/L (ref 3.5–5.1)
SODIUM: 142 mmol/L (ref 135–145)

## 2018-04-03 LAB — CBC
HCT: 44 % (ref 35.0–47.0)
HEMOGLOBIN: 14.6 g/dL (ref 12.0–16.0)
MCH: 29.7 pg (ref 26.0–34.0)
MCHC: 33.1 g/dL (ref 32.0–36.0)
MCV: 89.5 fL (ref 80.0–100.0)
Platelets: 209 10*3/uL (ref 150–440)
RBC: 4.91 MIL/uL (ref 3.80–5.20)
RDW: 14.2 % (ref 11.5–14.5)
WBC: 4.2 10*3/uL (ref 3.6–11.0)

## 2018-04-03 MED ORDER — ASPIRIN EC 81 MG PO TBEC
81.0000 mg | DELAYED_RELEASE_TABLET | Freq: Once | ORAL | Status: AC
  Administered 2018-04-03: 81 mg via ORAL
  Filled 2018-04-03: qty 1

## 2018-04-03 NOTE — Discharge Instructions (Signed)
Please begin taking a baby aspirin every day and make an appointment to follow-up with the cardiologist this coming week for reevaluation.  Return to the emergency department sooner for any concerns.  It was a pleasure to take care of you today, and thank you for coming to our emergency department.  If you have any questions or concerns before leaving please ask the nurse to grab me and I'm more than happy to go through your aftercare instructions again.  If you were prescribed any opioid pain medication today such as Norco, Vicodin, Percocet, morphine, hydrocodone, or oxycodone please make sure you do not drive when you are taking this medication as it can alter your ability to drive safely.  If you have any concerns once you are home that you are not improving or are in fact getting worse before you can make it to your follow-up appointment, please do not hesitate to call 911 and come back for further evaluation.  Darel Hong, MD  Results for orders placed or performed during the hospital encounter of 44/03/47  Basic metabolic panel  Result Value Ref Range   Sodium 142 135 - 145 mmol/L   Potassium 3.9 3.5 - 5.1 mmol/L   Chloride 108 101 - 111 mmol/L   CO2 28 22 - 32 mmol/L   Glucose, Bld 93 65 - 99 mg/dL   BUN 13 6 - 20 mg/dL   Creatinine, Ser 0.81 0.44 - 1.00 mg/dL   Calcium 8.9 8.9 - 10.3 mg/dL   GFR calc non Af Amer >60 >60 mL/min   GFR calc Af Amer >60 >60 mL/min   Anion gap 6 5 - 15  CBC  Result Value Ref Range   WBC 4.2 3.6 - 11.0 K/uL   RBC 4.91 3.80 - 5.20 MIL/uL   Hemoglobin 14.6 12.0 - 16.0 g/dL   HCT 44.0 35.0 - 47.0 %   MCV 89.5 80.0 - 100.0 fL   MCH 29.7 26.0 - 34.0 pg   MCHC 33.1 32.0 - 36.0 g/dL   RDW 14.2 11.5 - 14.5 %   Platelets 209 150 - 440 K/uL   Dg Chest 2 View  Result Date: 04/03/2018 CLINICAL DATA:  Hypertension.  Atrial fibrillation EXAM: CHEST - 2 VIEW COMPARISON:  Chest CT December 24, 2011 FINDINGS: There is no edema or consolidation. There is  cardiomegaly with pulmonary vascularity within normal limits. No adenopathy. There is degenerative change in the thoracic spine. IMPRESSION: No edema or consolidation.  Cardiomegaly present. Electronically Signed   By: Lowella Grip III M.D.   On: 04/03/2018 15:29   Mm Diag Breast Tomo Bilateral  Result Date: 03/31/2018 CLINICAL DATA:  77 year old female presenting for routine annual surveillance status post right breast lumpectomy in 2012. EXAM: DIGITAL DIAGNOSTIC BILATERAL MAMMOGRAM WITH CAD AND TOMO COMPARISON:  Previous exam(s). ACR Breast Density Category b: There are scattered areas of fibroglandular density. FINDINGS: The right breast lumpectomy site is stable. No suspicious calcifications, masses or areas of distortion are seen in the bilateral breasts. Mammographic images were processed with CAD. IMPRESSION: Stable right breast lumpectomy site. No mammographic evidence of malignancy in the bilateral breasts. RECOMMENDATION: Screening mammogram in one year.(Code:SM-B-01Y) I have discussed the findings and recommendations with the patient. Results were also provided in writing at the conclusion of the visit. If applicable, a reminder letter will be sent to the patient regarding the next appointment. BI-RADS CATEGORY  2: Benign. Electronically Signed   By: Ammie Ferrier M.D.   On: 03/31/2018 11:32

## 2018-04-03 NOTE — ED Provider Notes (Signed)
[facility] Emergency Department Provider Note  ____________________________________________   First MD Initiated Contact with Patient 04/03/18 1559     (approximate)  I have reviewed the triage vital signs and the nursing notes.   HISTORY  Chief Complaint Hypertension; Atrial Fibrillation; and Headache   HPI Olivia Burton is a 77 y.o. female who self presents to the emergency department with what appears to be new onset atrial fibrillation.  She went to urgent care today concerned that her blood pressure was elevated and they performed an EKG noting atrial fibrillation which was rate controlled so they advised that she come to the emergency department.  She denies chest pain, shortness of breath, abdominal pain, nausea, vomiting.  She has had some intermittent gradual onset not maximal onset bifrontal throbbing moderate severity headache similar to previous headaches for the past week or so.  She checked her blood pressure at home multiple times throughout the past day or 2 and noted it was elevated which concerned her which prompted the visit to the urgent care today.  Past Medical History:  Diagnosis Date  . Breast cancer ([place]) 2012   Right breast  . DCIS (ductal carcinoma in situ)   . Osteoarthritis   . Personal history of radiation therapy     Patient Active Problem List   Diagnosis Date Noted  . Ductal carcinoma in situ (DCIS) of right breast 10/07/2016    Past Surgical History:  Procedure Laterality Date  . BREAST BIOPSY Right 2012   +  . BREAST LUMPECTOMY Right 2012  . BUNIONECTOMY    . CERVICAL LAMINECTOMY    . ROTATOR CUFF REPAIR      Prior to Admission medications   Medication Sig Start Date End Date Taking? Authorizing Provider  Biotin 1000 MCG tablet Take by mouth.    [provider]  calcium carbonate (TUMS - DOSED IN MG ELEMENTAL CALCIUM) 500 MG chewable tablet Chew by mouth.    [provider]  CO ENZYME Q-10  PO Take by mouth.    [provider]  Cyanocobalamin (VITAMIN B 12 PO) Take by mouth.    [provider]  latanoprost (XALATAN) 0.005 % ophthalmic solution Place 1 drop into both eyes at bedtime. 09/19/15   [provider]  Red Yeast Rice 600 MG TABS Take by mouth.    [provider]  SIMBRINZA 1-0.2 % SUSP INSTILL 1 DROP INTO BOTH EYES TWICE A DAY 09/12/15   [provider]  tamoxifen (NOLVADEX) 20 MG tablet Take 1 tablet (20 mg total) by mouth daily. 10/01/15   Lloyd Huger, MD  vitamin C (ASCORBIC ACID) 500 MG tablet Take by mouth.    [provider]    Allergies Quinine  Family History  Problem Relation Age of Onset  . Breast cancer Mother 91  . Coronary artery disease Mother   . Hypertension Mother     Social History Social History   Tobacco Use  . Smoking status: Not on file  Substance Use Topics  . Alcohol use: Not on file  . Drug use: Not on file    Review of Systems Constitutional: No fever/chills Eyes: No visual changes. ENT: No sore throat. Cardiovascular: Denies chest pain. Respiratory: Denies shortness of breath. Gastrointestinal: No abdominal pain.  No nausea, no vomiting.  No diarrhea.  No constipation. Genitourinary: Negative for dysuria. Musculoskeletal: Negative for back pain. Skin: Negative for rash. Neurological: Positive for headache   ____________________________________________   PHYSICAL EXAM:  VITAL SIGNS: ED Triage Vitals  Enc Vitals Group     BP 04/03/18 1445 (!) 151/75     Pulse Rate 04/03/18 1445 72     Resp 04/03/18 1445 20     Temp 04/03/18 1445 97.8 F (36.6 C)     Temp Source 04/03/18 1445 Oral     SpO2 04/03/18 1445 98 %     Weight 04/03/18 1441 178 lb (80.7 kg)     Height 04/03/18 1441 5\' 9"  (1.753 m)     Head Circumference --      Peak Flow --      Pain Score 04/03/18 1441 0     Pain Loc --      Pain Edu? --      Excl. in [place]? --     Constitutional: Alert  and oriented x4 joking laughing well-appearing nontoxic no diaphoresis speaks full clear sentences Eyes: PERRL EOMI. Head: Atraumatic. Nose: No congestion/rhinnorhea. Mouth/Throat: No trismus Neck: No stridor.   Cardiovascular: Irregularly irregular with no murmurs and rate controlled Respiratory: Normal respiratory effort.  No retractions. Lungs CTAB and moving good air Gastrointestinal: Soft nontender Musculoskeletal: No lower extremity edema   Neurologic:  Normal speech and language. No gross focal neurologic deficits are appreciated. Skin:  Skin is warm, dry and intact. No rash noted. Psychiatric: Mood and affect are normal. Speech and behavior are normal.    ____________________________________________   DIFFERENTIAL includes but not limited to  Atrial fibrillation, atrial flutter, SVT, ventricular tachycardia, dehydration ____________________________________________   LABS (all labs ordered are listed, but only abnormal results are displayed)  Labs Reviewed  BASIC METABOLIC PANEL  CBC    Lab work reviewed by me with no acute disease __________________________________________  EKG  ED ECG REPORT I, Darel Hong, the attending physician, personally viewed and interpreted this ECG.  Date: 04/03/2018 EKG Time:  Rate: 79 Rhythm: Atrial flutter QRS Axis: Rightward axis Intervals: normal ST/T Wave abnormalities: normal Narrative Interpretation: no evidence of acute ischemia  ____________________________________________  RADIOLOGY  Chest x-ray reviewed by me with some cardiomegaly but no acute disease ____________________________________________   PROCEDURES  Procedure(s) performed: no  Procedures  Critical Care performed: no  Observation: no ____________________________________________   INITIAL IMPRESSION / ASSESSMENT AND PLAN / ED COURSE  Pertinent labs & imaging results that were available during my care of the patient were reviewed by me and  considered in my medical decision making (see chart for details).  Patient arrives hemodynamically stable and well-appearing.  She has what appears to be either atrial fibrillation or atrial flutter although is rate controlled.  No evidence of dehydration.  I encouraged her to begin taking a baby aspirin a day and she says she is able to follow-up with cardiology within the next few days for reevaluation and to consider possible nodal blockade etc.  Strict return precautions have been given to the patient verbalized understanding and agreement with the plan.      ____________________________________________   FINAL CLINICAL IMPRESSION(S) / ED DIAGNOSES  Final diagnoses:  Chronic atrial fibrillation ([place])      NEW MEDICATIONS STARTED DURING THIS VISIT:  Discharge Medication List as of 04/03/2018  4:30 PM       Note:  This document was prepared using Dragon voice recognition software and may include unintentional dictation errors.     Darel Hong, MD 04/06/18 2242

## 2018-04-03 NOTE — ED Triage Notes (Signed)
Patient presents to the ED with hypertension for the past week.  Patient went to urgent care today and was told she had an irregular heart beat and should come to the ED.  Patient brought an EKG from urgent care that showed Afib.  Patient reports she has had an increase in headaches for the past week as well.  Patient is A&O x 4 at this time.  Patient denies dizziness and chest pain at this time.

## 2018-04-14 ENCOUNTER — Inpatient Hospital Stay: Payer: Medicare Other | Admitting: Oncology

## 2018-04-17 NOTE — Progress Notes (Signed)
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  INTERVAL HISTORY: Patient returns to clinic today for routine yearly evaluation.  She continues to feel well and remains asymptomatic.  She denies any neurologic complaints.  She has no recent fevers or illnesses.  She has a good appetite and denies weight loss.  She denies any chest pain or shortness of breath. She has no nausea, vomiting, constipation, or diarrhea.  She has no urinary complaints.  Feels at her baseline offers no specific complaints today.  REVIEW OF SYSTEMS:   Review of Systems  Constitutional: Negative.  Negative for fever, malaise/fatigue and weight loss.  Respiratory: Negative.  Negative for cough and shortness of breath.   Cardiovascular: Negative.  Negative for chest pain and leg swelling.  Gastrointestinal: Negative.  Negative for abdominal pain and constipation.  Genitourinary: Negative.  Negative for dysuria.  Musculoskeletal: Negative.   Skin: Negative.  Negative for rash.  Neurological: Negative.  Negative for sensory change, focal weakness and weakness.  Psychiatric/Behavioral: Negative.  The patient is not nervous/anxious.     As per HPI. Otherwise, a complete review of systems is negative.  PAST MEDICAL HISTORY: Past Medical History:  Diagnosis Date  . Breast cancer ([place]) 2012   Right breast  . DCIS (ductal carcinoma in situ)   . Osteoarthritis   . Personal history of radiation therapy     PAST SURGICAL HISTORY: Past Surgical History:  Procedure Laterality Date  . BREAST BIOPSY Right 2012   +  . BREAST LUMPECTOMY Right 2012  . BUNIONECTOMY    . CERVICAL LAMINECTOMY    . ROTATOR CUFF REPAIR      FAMILY HISTORY Family History  Problem Relation Age of Onset  . Breast cancer  Mother 61  . Coronary artery disease Mother   . Hypertension Mother        ADVANCED DIRECTIVES:    HEALTH MAINTENANCE: Social History   Tobacco Use  . Smoking status: Never Smoker  . Smokeless tobacco: Never Used  Substance Use Topics  . Alcohol use: Never    Frequency: Never  . Drug use: Never    Allergies  Allergen Reactions  . Quinine Hives    Current Outpatient Medications  Medication Sig Dispense Refill  . apixaban (ELIQUIS) 5 MG TABS tablet Take by mouth.    . B Complex-Folic Acid (SM BALANCED B-100) TABS Take by mouth.    . Biotin 1000 MCG tablet Take by mouth.    . calcium carbonate (TUMS - DOSED IN MG ELEMENTAL CALCIUM) 500 MG chewable tablet Chew by mouth.    . CO ENZYME Q-10 PO Take by mouth.    . Cyanocobalamin (VITAMIN B 12 PO) Take by mouth.    . latanoprost (XALATAN) 0.005 % ophthalmic solution Place 1 drop into both eyes at bedtime.  5  . Multiple Vitamin (MULTI-VITAMINS) TABS Take by mouth.    . Red Yeast Rice 600 MG TABS Take by mouth.    . rosuvastatin (CRESTOR) 10 MG tablet Take by mouth.    Marland Kitchen SIMBRINZA 1-0.2 % SUSP INSTILL 1 DROP INTO BOTH EYES TWICE A DAY  5  . vitamin C (ASCORBIC ACID) 500 MG tablet Take by mouth.     No current facility-administered medications for this visit.     OBJECTIVE: Vitals:  04/20/18 1048  BP: (!) 156/92  Pulse: 73  Resp: 18  Temp: 97.6 F (36.4 C)     Body mass index is 25.7 kg/m.    ECOG FS:0 - Asymptomatic  General: Well-developed, well-nourished, no acute distress. Eyes: Pink conjunctiva, anicteric sclera. Breast: Bilateral breast and axilla without lumps or masses. Lungs: Clear to auscultation bilaterally. Heart: Regular rate and rhythm. No rubs, murmurs, or gallops. Abdomen: Soft, nontender, nondistended. No organomegaly noted, normoactive bowel sounds. Musculoskeletal: No edema, cyanosis, or clubbing. Neuro: Alert, answering all questions appropriately. Cranial nerves grossly intact. Skin: No  rashes or petechiae noted. Psych: Normal affect.  LAB RESULTS:  Lab Results  Component Value Date   NA 142 04/03/2018   K 3.9 04/03/2018   CL 108 04/03/2018   CO2 28 04/03/2018   GLUCOSE 93 04/03/2018   BUN 13 04/03/2018   CREATININE 0.81 04/03/2018   CALCIUM 8.9 04/03/2018   GFRNONAA >60 04/03/2018   GFRAA >60 04/03/2018    Lab Results  Component Value Date   WBC 4.2 04/03/2018   NEUTROABS 1.2 (L) 01/03/2013   HGB 14.6 04/03/2018   HCT 44.0 04/03/2018   MCV 89.5 04/03/2018   PLT 209 04/03/2018     STUDIES: Dg Chest 2 View  Result Date: 04/03/2018 CLINICAL DATA:  Hypertension.  Atrial fibrillation EXAM: CHEST - 2 VIEW COMPARISON:  Chest CT December 24, 2011 FINDINGS: There is no edema or consolidation. There is cardiomegaly with pulmonary vascularity within normal limits. No adenopathy. There is degenerative change in the thoracic spine. IMPRESSION: No edema or consolidation.  Cardiomegaly present. Electronically Signed   By: Lowella Grip III M.D.   On: 04/03/2018 15:29   Mm Diag Breast Tomo Bilateral  Result Date: 03/31/2018 CLINICAL DATA:  77 year old female presenting for routine annual surveillance status post right breast lumpectomy in 2012. EXAM: DIGITAL DIAGNOSTIC BILATERAL MAMMOGRAM WITH CAD AND TOMO COMPARISON:  Previous exam(s). ACR Breast Density Category b: There are scattered areas of fibroglandular density. FINDINGS: The right breast lumpectomy site is stable. No suspicious calcifications, masses or areas of distortion are seen in the bilateral breasts. Mammographic images were processed with CAD. IMPRESSION: Stable right breast lumpectomy site. No mammographic evidence of malignancy in the bilateral breasts. RECOMMENDATION: Screening mammogram in one year.(Code:SM-B-01Y) I have discussed the findings and recommendations with the patient. Results were also provided in writing at the conclusion of the visit. If applicable, a reminder letter will be sent to the  patient regarding the next appointment. BI-RADS CATEGORY  2: Benign. Electronically Signed   By: Ammie Ferrier M.D.   On: 03/31/2018 11:32    ASSESSMENT: DCIS of the right breast  PLAN:    1. DCIS of the right breast: No evidence of disease.  Patient's most recent mammogram on March 31, 2018 is reported as BI-RADS 2.  Despite her admitted noncompliance, patient completed nearly 5 years of tamoxifen.  No intervention is needed at this time.  No follow-up has been scheduled.  Patient expressed understanding that she still requires a yearly mammograms which can now be ordered by her PCP.   2. Hypertension: Patient's blood pressure moderately elevated today.  Continue evaluation and treatment per primary care.   Approximately 20 minutes was spent in discussion of which greater than 50% was consultation.  Patient expressed understanding and was in agreement with this plan. She also understands that She can call clinic at any time with any questions, concerns, or complaints.    Lloyd Huger, MD  04/21/2018 8:16 PM

## 2018-04-20 ENCOUNTER — Encounter: Payer: Self-pay | Admitting: Oncology

## 2018-04-20 ENCOUNTER — Inpatient Hospital Stay: Payer: Medicare Other | Attending: Oncology | Admitting: Oncology

## 2018-04-20 VITALS — BP 156/92 | HR 73 | Temp 97.6°F | Resp 18 | Wt 174.1 lb

## 2018-04-20 DIAGNOSIS — M1990 Unspecified osteoarthritis, unspecified site: Secondary | ICD-10-CM | POA: Insufficient documentation

## 2018-04-20 DIAGNOSIS — I4891 Unspecified atrial fibrillation: Secondary | ICD-10-CM | POA: Diagnosis not present

## 2018-04-20 DIAGNOSIS — Z79899 Other long term (current) drug therapy: Secondary | ICD-10-CM

## 2018-04-20 DIAGNOSIS — Z7901 Long term (current) use of anticoagulants: Secondary | ICD-10-CM | POA: Insufficient documentation

## 2018-04-20 DIAGNOSIS — Z9223 Personal history of estrogen therapy: Secondary | ICD-10-CM | POA: Diagnosis not present

## 2018-04-20 DIAGNOSIS — I517 Cardiomegaly: Secondary | ICD-10-CM | POA: Diagnosis not present

## 2018-04-20 DIAGNOSIS — Z170 Estrogen receptor positive status [ER+]: Secondary | ICD-10-CM | POA: Diagnosis not present

## 2018-04-20 DIAGNOSIS — Z86000 Personal history of in-situ neoplasm of breast: Secondary | ICD-10-CM | POA: Diagnosis not present

## 2018-04-20 DIAGNOSIS — I10 Essential (primary) hypertension: Secondary | ICD-10-CM

## 2018-04-20 DIAGNOSIS — Z923 Personal history of irradiation: Secondary | ICD-10-CM | POA: Diagnosis not present

## 2018-04-20 DIAGNOSIS — D0511 Intraductal carcinoma in situ of right breast: Secondary | ICD-10-CM

## 2018-04-20 NOTE — Progress Notes (Signed)
Pt in for follow up.  States doing well. Has been experiencing some elevated blood pressure and cholesterol.  Now taking meds for both.

## 2018-05-12 ENCOUNTER — Observation Stay
Admission: EM | Admit: 2018-05-12 | Discharge: 2018-05-13 | Disposition: A | Discharge status: Home / Self Care | Payer: Medicare Other | Attending: Internal Medicine | Admitting: Internal Medicine

## 2018-05-12 ENCOUNTER — Emergency Department: Payer: Medicare Other

## 2018-05-12 ENCOUNTER — Encounter: Payer: Self-pay | Admitting: *Deleted

## 2018-05-12 ENCOUNTER — Observation Stay: Payer: Medicare Other

## 2018-05-12 DIAGNOSIS — Z923 Personal history of irradiation: Secondary | ICD-10-CM | POA: Diagnosis not present

## 2018-05-12 DIAGNOSIS — Z853 Personal history of malignant neoplasm of breast: Secondary | ICD-10-CM | POA: Diagnosis not present

## 2018-05-12 DIAGNOSIS — I639 Cerebral infarction, unspecified: Secondary | ICD-10-CM | POA: Diagnosis not present

## 2018-05-12 DIAGNOSIS — R531 Weakness: Secondary | ICD-10-CM | POA: Diagnosis not present

## 2018-05-12 DIAGNOSIS — R262 Difficulty in walking, not elsewhere classified: Secondary | ICD-10-CM | POA: Insufficient documentation

## 2018-05-12 DIAGNOSIS — Z7901 Long term (current) use of anticoagulants: Secondary | ICD-10-CM | POA: Diagnosis not present

## 2018-05-12 DIAGNOSIS — M1990 Unspecified osteoarthritis, unspecified site: Secondary | ICD-10-CM | POA: Insufficient documentation

## 2018-05-12 DIAGNOSIS — I482 Chronic atrial fibrillation: Secondary | ICD-10-CM | POA: Insufficient documentation

## 2018-05-12 DIAGNOSIS — Z79899 Other long term (current) drug therapy: Secondary | ICD-10-CM | POA: Diagnosis not present

## 2018-05-12 DIAGNOSIS — R278 Other lack of coordination: Secondary | ICD-10-CM | POA: Insufficient documentation

## 2018-05-12 DIAGNOSIS — Z888 Allergy status to other drugs, medicaments and biological substances status: Secondary | ICD-10-CM | POA: Insufficient documentation

## 2018-05-12 LAB — DIFFERENTIAL
Basophils Absolute: 0 10*3/uL (ref 0–0.1)
Basophils Relative: 1 %
EOS PCT: 1 %
Eosinophils Absolute: 0 10*3/uL (ref 0–0.7)
LYMPHS ABS: 1.8 10*3/uL (ref 1.0–3.6)
LYMPHS PCT: 47 %
MONO ABS: 0.4 10*3/uL (ref 0.2–0.9)
Monocytes Relative: 11 %
Neutro Abs: 1.6 10*3/uL (ref 1.4–6.5)
Neutrophils Relative %: 40 %

## 2018-05-12 LAB — CBC
HCT: 42.6 % (ref 35.0–47.0)
Hemoglobin: 14.1 g/dL (ref 12.0–16.0)
MCH: 29.9 pg (ref 26.0–34.0)
MCHC: 33.1 g/dL (ref 32.0–36.0)
MCV: 90.3 fL (ref 80.0–100.0)
PLATELETS: 177 10*3/uL (ref 150–440)
RBC: 4.72 MIL/uL (ref 3.80–5.20)
RDW: 14.5 % (ref 11.5–14.5)
WBC: 3.9 10*3/uL (ref 3.6–11.0)

## 2018-05-12 LAB — COMPREHENSIVE METABOLIC PANEL
ALT: 19 U/L (ref 14–54)
AST: 27 U/L (ref 15–41)
Albumin: 3.8 g/dL (ref 3.5–5.0)
Alkaline Phosphatase: 66 U/L (ref 38–126)
Anion gap: 6 (ref 5–15)
BILIRUBIN TOTAL: 1.1 mg/dL (ref 0.3–1.2)
BUN: 14 mg/dL (ref 6–20)
CALCIUM: 8.7 mg/dL — AB (ref 8.9–10.3)
CO2: 26 mmol/L (ref 22–32)
CREATININE: 0.8 mg/dL (ref 0.44–1.00)
Chloride: 111 mmol/L (ref 101–111)
GFR calc Af Amer: >60 mL/min (ref >60)
Glucose, Bld: 98 mg/dL (ref 65–99)
Potassium: 4 mmol/L (ref 3.5–5.1)
Sodium: 143 mmol/L (ref 135–145)
TOTAL PROTEIN: 6.7 g/dL (ref 6.5–8.1)

## 2018-05-12 LAB — PROTIME-INR
INR: 1.09
Prothrombin Time: 14 seconds (ref 11.4–15.2)

## 2018-05-12 LAB — APTT: aPTT: 36 seconds (ref 24–36)

## 2018-05-12 LAB — TROPONIN I

## 2018-05-12 MED ORDER — LABETALOL HCL 5 MG/ML IV SOLN
10.0000 mg | INTRAVENOUS | Status: DC | PRN
  Filled 2018-05-12: qty 4

## 2018-05-12 MED ORDER — HYDRALAZINE HCL 20 MG/ML IJ SOLN: 10.0000 mg | Freq: Four times a day (QID) | INTRAMUSCULAR | Status: DC | PRN

## 2018-05-12 MED ORDER — ACETAMINOPHEN 325 MG PO TABS
650.0000 mg | ORAL_TABLET | Freq: Four times a day (QID) | ORAL | Status: DC | PRN
  Administered 2018-05-13 (×2): 650 mg via ORAL
  Filled 2018-05-12 (×2): qty 2

## 2018-05-12 MED ORDER — APIXABAN 5 MG PO TABS
5.0000 mg | ORAL_TABLET | Freq: Two times a day (BID) | ORAL | Status: DC
  Administered 2018-05-13: 5 mg via ORAL
  Filled 2018-05-12: qty 1

## 2018-05-12 MED ORDER — ONDANSETRON HCL 4 MG/2ML IJ SOLN: 4.0000 mg | Freq: Four times a day (QID) | INTRAMUSCULAR | Status: DC | PRN

## 2018-05-12 MED ORDER — LATANOPROST 0.005 % OP SOLN
1.0000 [drp] | Freq: Every day | OPHTHALMIC | Status: DC
  Filled 2018-05-12: qty 2.5

## 2018-05-12 MED ORDER — ROSUVASTATIN CALCIUM 10 MG PO TABS
10.0000 mg | ORAL_TABLET | Freq: Every day | ORAL | Status: DC
  Administered 2018-05-13: 10:00:00 10 mg via ORAL
  Filled 2018-05-12: qty 1

## 2018-05-12 MED ORDER — ONDANSETRON HCL 4 MG PO TABS
4.0000 mg | ORAL_TABLET | Freq: Four times a day (QID) | ORAL | Status: DC | PRN
Start: 2018-05-12 — End: 2018-05-13

## 2018-05-12 MED ORDER — ACETAMINOPHEN 650 MG RE SUPP: 650.0000 mg | Freq: Four times a day (QID) | RECTAL | Status: DC | PRN

## 2018-05-12 MED ORDER — ASPIRIN 81 MG PO CHEW
324.0000 mg | CHEWABLE_TABLET | Freq: Once | ORAL | Status: AC
  Administered 2018-05-12: 324 mg via ORAL
  Filled 2018-05-12: qty 4

## 2018-05-12 NOTE — H&P (Signed)
[facility] [place] NAME: Olivia Burton    MR#:  [id number]  DATE OF BIRTH:  [birth year]  DATE OF ADMISSION:  05/12/2018  PRIMARY CARE PHYSICIAN: Derinda Late, MD   REQUESTING/REFERRING PHYSICIAN: Dr. Delman Kitten  CHIEF COMPLAINT:   Chief Complaint  Patient presents with  . Weakness    HISTORY OF PRESENT ILLNESS:  Olivia Burton  is a 77 y.o. female with a known history of chronic H fibrillation, history of breast cancer, osteoarthritis who presents to the hospital due to right lower extremity weakness, difficulty using her right hand when trying to text.  Patient says she woke up this morning and was having trouble walking and had to hold onto things as her right leg felt weak.  She was having also some trouble texting the day before yesterday as she was having difficulty controlling her right arm.  Because her symptoms got worse this morning she came to the ER for further evaluation.  Patient continues to have significant right-sided dysmetria on the upper extremity with some right lower extremity weakness.  Patient CT head is negative for acute CVA but given her persistent symptoms hospitalist services were contacted for admission.  Patient denies any headache, nausea, vomiting, chest pain, shortness of breath, fever, chills, cough or any other associated symptoms presently.  PAST MEDICAL HISTORY:   Past Medical History:  Diagnosis Date  . Breast cancer ([place]) 2012   Right breast  . DCIS (ductal carcinoma in situ)   . Osteoarthritis   . Personal history of radiation therapy     PAST SURGICAL HISTORY:   Past Surgical History:  Procedure Laterality Date  . BREAST BIOPSY Right 2012   +  . BREAST LUMPECTOMY Right 2012  . BUNIONECTOMY    . CERVICAL LAMINECTOMY    . ROTATOR CUFF REPAIR      SOCIAL HISTORY:   Social History   Tobacco Use  . Smoking status: Never Smoker  . Smokeless tobacco: Never Used  Substance Use Topics  .  Alcohol use: Never    Frequency: Never    FAMILY HISTORY:   Family History  Problem Relation Age of Onset  . Breast cancer Mother 28  . Coronary artery disease Mother   . Hypertension Mother   . Stroke Mother   . Heart attack Father     DRUG ALLERGIES:   Allergies  Allergen Reactions  . Quinine Hives    REVIEW OF SYSTEMS:   Review of Systems  Constitutional: Negative for fever and weight loss.  HENT: Negative for congestion, nosebleeds and tinnitus.   Eyes: Negative for blurred vision, double vision and redness.  Respiratory: Negative for cough, hemoptysis and shortness of breath.   Cardiovascular: Negative for chest pain, orthopnea, leg swelling and PND.  Gastrointestinal: Negative for abdominal pain, diarrhea, melena, nausea and vomiting.  Genitourinary: Negative for dysuria, hematuria and urgency.  Musculoskeletal: Negative for falls and joint pain.  Neurological: Positive for weakness (Right Lower Ext. ). Negative for dizziness, tingling, sensory change, focal weakness, seizures and headaches.  Endo/Heme/Allergies: Negative for polydipsia. Does not bruise/bleed easily.  Psychiatric/Behavioral: Negative for depression and memory loss. The patient is not nervous/anxious.     MEDICATIONS AT HOME:   Prior to Admission medications   Medication Sig Start Date End Date Taking? Authorizing Provider  apixaban (ELIQUIS) 5 MG TABS tablet Take by mouth. 04/05/18   [provider]  B Complex-Folic Acid (SM BALANCED B-100) TABS Take by mouth.  [provider]  Biotin 1000 MCG tablet Take by mouth.    [provider]  calcium carbonate (TUMS - DOSED IN MG ELEMENTAL CALCIUM) 500 MG chewable tablet Chew by mouth.    [provider]  CO ENZYME Q-10 PO Take by mouth.    [provider]  Cyanocobalamin (VITAMIN B 12 PO) Take by mouth.    [provider]  latanoprost (XALATAN) 0.005 % ophthalmic solution Place 1 drop into both eyes  at bedtime. 09/19/15   [provider]  Multiple Vitamin (MULTI-VITAMINS) TABS Take by mouth.    [provider]  Red Yeast Rice 600 MG TABS Take by mouth.    [provider]  rosuvastatin (CRESTOR) 10 MG tablet Take by mouth. 04/05/18 04/05/19  [provider]  SIMBRINZA 1-0.2 % SUSP INSTILL 1 DROP INTO BOTH EYES TWICE A DAY 09/12/15   [provider]  vitamin C (ASCORBIC ACID) 500 MG tablet Take by mouth.    [provider]      VITAL SIGNS:  Blood pressure (!) 162/94, pulse (!) 59, temperature 97.7 F (36.5 C), resp. rate 16, height 5\' 9"  (1.753 m), weight 78.5 kg (173 lb), SpO2 100 %.  PHYSICAL EXAMINATION:  Physical Exam  GENERAL:  77 y.o.-year-old patient lying in the bed with no acute distress.  EYES: Pupils equal, round, reactive to light and accommodation. No scleral icterus. Extraocular muscles intact.  HEENT: Head atraumatic, normocephalic. Oropharynx and nasopharynx clear. No oropharyngeal erythema, moist oral mucosa  NECK:  Supple, no jugular venous distention. No thyroid enlargement, no tenderness.  LUNGS: Normal breath sounds bilaterally, no wheezing, rales, rhonchi. No use of accessory muscles of respiration.  CARDIOVASCULAR: S1, S2 Irregular. No murmurs, rubs, gallops, clicks.  ABDOMEN: Soft, nontender, nondistended. Bowel sounds present. No organomegaly or mass.  EXTREMITIES: No pedal edema, cyanosis, or clubbing. + 2 pedal & radial pulses b/l.   NEUROLOGIC: Cranial nerves II through XII are intact. No focal Motor or sensory deficits appreciated b/l.  Positive dysmetria on the right upper extremity, positive finger-to-nose test on the right upper extremity.  Patient also has some right lower extremity weakness about a 4-5 strength. PSYCHIATRIC: The patient is alert and oriented x 3. Good affect.  SKIN: No obvious rash, lesion, or ulcer.   LABORATORY PANEL:   CBC Recent Labs  Lab 05/12/18 1857  WBC 3.9  HGB 14.1   HCT 42.6  PLT 177   ------------------------------------------------------------------------------------------------------------------  Chemistries  Recent Labs  Lab 05/12/18 1857  NA 143  K 4.0  CL 111  CO2 26  GLUCOSE 98  BUN 14  CREATININE 0.80  CALCIUM 8.7*  AST 27  ALT 19  ALKPHOS 66  BILITOT 1.1   ------------------------------------------------------------------------------------------------------------------  Cardiac Enzymes Recent Labs  Lab 05/12/18 1857  TROPONINI <0.03   ------------------------------------------------------------------------------------------------------------------  RADIOLOGY:  Ct Head Wo Contrast  Result Date: 05/12/2018 CLINICAL DATA:  New onset right-sided weakness. EXAM: CT HEAD WITHOUT CONTRAST TECHNIQUE: Contiguous axial images were obtained from the base of the skull through the vertex without intravenous contrast. COMPARISON:  None. FINDINGS: Brain: There is no evidence for acute hemorrhage, hydrocephalus, mass lesion, or abnormal extra-axial fluid collection. No definite CT evidence for acute infarction. Vascular: No hyperdense vessel or unexpected calcification. Skull: No evidence for fracture. No worrisome lytic or sclerotic lesion. Sinuses/Orbits: The visualized paranasal sinuses and mastoid air cells are clear. Visualized portions of the globes and intraorbital fat are unremarkable. Other: None. IMPRESSION: Normal exam for patient age.  Electronically Signed   By: Misty Stanley M.D.   On: 05/12/2018 19:05     IMPRESSION AND PLAN:   77 year old female with past medical history of breast cancer, chronic atrial fibrillation, osteoarthritis who presents to the hospital due to right lower extremity weakness and also having difficulty coordinating her right arm.  1.  Acute CVA-this is a suspected diagnosis given the patient's right lower extremity weakness, dysmetria on the right upper extremity. -Patient has a history of chronic  atrial fibrillation and is on Eliquis but says she has not been completely compliant with it. - Her CT head is negative for acute stroke.  I will get a MRI of her brain.  Get a carotid duplex, echocardiogram. -Continue her Eliquis for now.  2.  Hyperlipidemia-continue Crestor.  3.  Chronic atrial fibrillation-rate controlled.  Continue Eliquis.  4.  Glaucoma-continue latanoprost eyedrops.    All the records are reviewed and case discussed with ED provider. Management plans discussed with the patient, family and they are in agreement.  CODE STATUS: Full code  TOTAL TIME TAKING CARE OF THIS PATIENT: 45 minutes.    Henreitta Leber M.D on 05/12/2018 at 9:02 PM  Between 7am to 6pm - Pager - [phone]  After 6pm go to [email or web address] - password [email or web address] [facility]  [facility]  Office  [phone]  CC: Primary care physician; Derinda Late, MD

## 2018-05-12 NOTE — ED Notes (Signed)
Pt states she woke up this AM and had R leg weakness, states it was dragging. She woke up at 7:30. Then states that about 8:30 noticed that she couldn't write, was just "writing shapes."  Pt denies vision or speech changes.

## 2018-05-12 NOTE — Consult Note (Signed)
   [facility] Tele[facility]urology Consult Services  Impression:  Patient with complaints of feeling off balance and with right-sided weakness.  I don't see any deficits on her exam, including ataxia.  Would rule out stroke with MRI brain.  Recommendations:  - MRI brain wo - hold Eliquis until MRI can evaluate size of stroke (if present) - if MRI is positive, patient will need further evaluation with neurology as inpatient - discussed with ED MD ---------------------------------------------------------------------  CC: ataxia  History of Present Illness:  STAT consult requested by Dr. Jacqualine Code of ED. 77 year old woman with a history of afib on Eliquis who presented feeling off balance and right-sided weakness.  She states her last normal was yesterday at 1900.  She went to bed and woke at just before 0500 and on rising felt off balance.  She feels her right side is weaker.  No sensory loss, vision changes, speech/language disturbance, vertigo, hearing change, HA, CP.  ED MD opined the patient had right-sided ataxia on exam.  Diagnostic Testing: CT head wo - nothing acute  Vital Signs:   Reviewed in EMR  Exam:  Mental Status:  Awake, alert, oriented  Naming: Intact Repetition: Intact   Speech: fluent  Cranial Nerves:  Pupils: Equal round and reactive to light Extraocular movements: Intact in all cardinal gaze Ptosis: Absent Visual fields: Intact to finger counting Facial sensation: Intact to pin and light touch Facial movements: Intact and symmetric    Motor Exam:  No drift   Tremor/Abnormal Movements:  Resting tremor: Absent Intention tremor: Absent Postural tremor: Absent  Sensory Exam:   Light touch: Intact Pinprick: Intact    Coordination:   Finger to nose: Intact Heel to shin: Intact   Medical Decision Making:  - Extensive number of diagnosis or management options are considered above.   - Extensive amount of complex data reviewed.   - High risk of  complication and/or morbidity or mortality are associated with differential diagnostic considerations above.  - There may be uncertain outcome and increased probability of prolonged functional impairment or high probability of severe prolonged functional impairment associated with some of these differential diagnosis.   Medical Data Reviewed:  1.Data reviewed include clinical labs, radiology,  Medical Tests;   2.Tests results discussed w/performing or interpreting physician;   3.Obtaining/reviewing old medical records;  4.Obtaining case history from another source;  5.Independent review of image, tracing or specimen.    Patient was informed the Neurology Consult would happen via TeleHealth consult by way of interactive audio and video telecommunications and consented to receiving care in this manner.

## 2018-05-12 NOTE — ED Provider Notes (Signed)
[facility] Emergency Department Provider Note  ____________________________________________   First MD Initiated Contact with Patient 05/12/18 1904     (approximate)  I have reviewed the triage vital signs and the nursing notes.   HISTORY  Chief Complaint Weakness    HPI Olivia Burton is a 77 y.o. female reports a recent history of onset of atrial fibrillation.  Patient recently started on anticoagulation.  Reports recently seen Dr. Clayborn Bigness.  This morning she woke up to use the bathroom at 4:56 AM, and she reports when she got up she felt very off balance.  She also got up again at about 7 AM reports that she has been feeling very off balance having to hold onto things and have assistance with walking.  She also noticed that when she has been using her phone she is had trouble touching the numbers are using text messaging because the right hand does not seem to work right in her right foot feels weak or off balance.  No headache.  No nausea or vomiting.  No trouble breathing.  No chest pain.  Denies facial weakness.  Denies weakness in the muscles except her right foot seems like it just is off balance and the right hand does not seem to work quite the way it should.     Past Medical History:  Diagnosis Date  . Breast cancer ([place]) 2012   Right breast  . DCIS (ductal carcinoma in situ)   . Osteoarthritis   . Personal history of radiation therapy     Patient Active Problem List   Diagnosis Date Noted  . Ductal carcinoma in situ (DCIS) of right breast 10/07/2016    Past Surgical History:  Procedure Laterality Date  . BREAST BIOPSY Right 2012   +  . BREAST LUMPECTOMY Right 2012  . BUNIONECTOMY    . CERVICAL LAMINECTOMY    . ROTATOR CUFF REPAIR      Prior to Admission medications   Medication Sig Start Date End Date Taking? Authorizing Provider  apixaban (ELIQUIS) 5 MG TABS tablet Take by mouth. 04/05/18   [provider]  B  Complex-Folic Acid (SM BALANCED B-100) TABS Take by mouth.    [provider]  Biotin 1000 MCG tablet Take by mouth.    [provider]  calcium carbonate (TUMS - DOSED IN MG ELEMENTAL CALCIUM) 500 MG chewable tablet Chew by mouth.    [provider]  CO ENZYME Q-10 PO Take by mouth.    [provider]  Cyanocobalamin (VITAMIN B 12 PO) Take by mouth.    [provider]  latanoprost (XALATAN) 0.005 % ophthalmic solution Place 1 drop into both eyes at bedtime. 09/19/15   [provider]  Multiple Vitamin (MULTI-VITAMINS) TABS Take by mouth.    [provider]  Red Yeast Rice 600 MG TABS Take by mouth.    [provider]  rosuvastatin (CRESTOR) 10 MG tablet Take by mouth. 04/05/18 04/05/19  [provider]  SIMBRINZA 1-0.2 % SUSP INSTILL 1 DROP INTO BOTH EYES TWICE A DAY 09/12/15   [provider]  vitamin C (ASCORBIC ACID) 500 MG tablet Take by mouth.    [provider]    Allergies Quinine  Family History  Problem Relation Age of Onset  . Breast cancer Mother 3  . Coronary artery disease Mother   . Hypertension Mother   . Stroke Mother   . Heart attack Father     Social History  Social History   Tobacco Use  . Smoking status: Never Smoker  . Smokeless tobacco: Never Used  Substance Use Topics  . Alcohol use: Never    Frequency: Never  . Drug use: Never    Review of Systems Constitutional: No fever/chills Eyes: No visual changes. ENT: No sore throat. Cardiovascular: Denies chest pain. Respiratory: Denies shortness of breath. Gastrointestinal: No abdominal pain.  No nausea, no vomiting.  No diarrhea.  No constipation. Genitourinary: Negative for dysuria. Musculoskeletal: Negative for back pain. Skin: Negative for rash. Neurological: Negative for headaches or numbness.  See HPI    ____________________________________________   PHYSICAL EXAM:  VITAL SIGNS: ED Triage  Vitals [05/12/18 1820]  Enc Vitals Group     BP (!) 162/94     Pulse Rate (!) 59     Resp 16     Temp 97.7 F (36.5 C)     Temp Source Oral     SpO2 100 %     Weight 173 lb (78.5 kg)     Height 5\' 9"  (1.753 m)     Head Circumference      Peak Flow      Pain Score 0     Pain Loc      Pain Edu?      Excl. in [place]?     Constitutional: Alert and oriented. Well appearing and in no acute distress. Eyes: Conjunctivae are normal. Head: Atraumatic. Nose: No congestion/rhinnorhea. Mouth/Throat: Mucous membranes are moist. Neck: No stridor.   Cardiovascular: Normal rate, regular rhythm. Grossly normal heart sounds.  Good peripheral circulation. Respiratory: Normal respiratory effort.  No retractions. Lungs CTAB. Gastrointestinal: Soft and nontender. No distention. Musculoskeletal: No lower extremity tenderness nor edema. Neurologic:  Normal speech and language. No gross focal neurologic deficits are appreciated.  On careful inspection she does have ataxia involving the right hand, also dysmetria.  In addition she has ataxia of the right leg.  She has normal strength in all extremities.  Normal sensation.  She is VAN scale negative for LVO. Skin:  Skin is warm, dry and intact. No rash noted. Psychiatric: Mood and affect are normal. Speech and behavior are normal. Passed bedside swallow administered by Iris (RN)  ____________________________________________   LABS (all labs ordered are listed, but only abnormal results are displayed)  Labs Reviewed  COMPREHENSIVE METABOLIC PANEL - Abnormal; Notable for the following components:      Result Value   Calcium 8.7 (*)    All other components within normal limits  PROTIME-INR  APTT  CBC  DIFFERENTIAL  TROPONIN I  CBG MONITORING, ED   ____________________________________________  EKG  Reviewed enterotomy at 1830 Heart rate 75 Cures 100 QTc 460 Atrial fibrillation, rate controlled, no evidence of  ischemia ____________________________________________  RADIOLOGY  CT the head negative for acute ____________________________________________   PROCEDURES  Procedure(s) performed: None  Procedures  Critical Care performed: Yes, see critical care note(s)  CRITICAL CARE Performed by: Delman Kitten   Total critical care time: 35 minutes  Critical care time was exclusive of separately billable procedures and treating other patients.  Critical care was necessary to treat or prevent imminent or life-threatening deterioration.  Critical care was time spent personally by me on the following activities: development of treatment plan with patient and/or surrogate as well as nursing, discussions with consultants, evaluation of patient's response to treatment, examination of patient, obtaining history from patient or surrogate, ordering and performing treatments and interventions, ordering and review of laboratory studies, ordering and review  of radiographic studies, pulse oximetry and re-evaluation of patient's condition.  ____________________________________________   INITIAL IMPRESSION / ASSESSMENT AND PLAN / ED COURSE  Pertinent labs & imaging results that were available during my care of the patient were reviewed by me and considered in my medical decision making (see chart for details).  Patient presents for rather abrupt onset of difficulty walking and use of her right hand.  On exam she does demonstrate ataxia, dysmetria, and given her recent diagnosis of A. fib I suspect she may have had a stroke despite her negative CT the head.  Discussed with Dr. Leonel Ramsay of neurology he recommends starting the patient on aspirin and obtain a tele-neurology consult along with admission to the [facility] hospital.  EKG does not demonstrate ischemia.  Blood work reassuring.  At this point I think the best explanation is likely neurologic etiology, likely small  stroke.  ----------------------------------------- 8:36 PM on 05/12/2018 -----------------------------------------  Discussed case with tele-neurologist, also Dr. Verdell Carmine.  Hospitalist admitting, tele-neurology presently performing consultation.  Patient and her husband are agreeable and understanding of plan for admission      ____________________________________________   FINAL CLINICAL IMPRESSION(S) / ED DIAGNOSES  Final diagnoses:  Cerebellar dysmetria  Acute ischemic stroke    NEW MEDICATIONS STARTED DURING THIS VISIT:  New Prescriptions   No medications on file     Note:  This document was prepared using Dragon voice recognition software and may include unintentional dictation errors.     Delman Kitten, MD 05/12/18 2037

## 2018-05-12 NOTE — ED Triage Notes (Signed)
Pt to Ed reporting new onset of right sided weakness at 730. Pt reports she started to drag her right leg and then noticed at 830 that she was having trouble witting. No headache, no changes in vision. No confusion per pt and family.   Pt reports three weeks ago she was seen in ED due to changed in BP and new onset of Afib. Pt is not on BP medications but is on Eliquis

## 2018-05-12 NOTE — ED Notes (Signed)
Pt taken to CT via stretcher, husband at bedside.  

## 2018-05-13 ENCOUNTER — Other Ambulatory Visit: Payer: Self-pay

## 2018-05-13 ENCOUNTER — Observation Stay: Payer: Medicare Other

## 2018-05-13 ENCOUNTER — Observation Stay
Admit: 2018-05-13 | Discharge: 2018-05-13 | Disposition: A | Discharge status: Home / Self Care | Payer: Medicare Other | Attending: Specialist | Admitting: Specialist

## 2018-05-13 DIAGNOSIS — I639 Cerebral infarction, unspecified: Secondary | ICD-10-CM

## 2018-05-13 DIAGNOSIS — R278 Other lack of coordination: Secondary | ICD-10-CM | POA: Diagnosis not present

## 2018-05-13 DIAGNOSIS — I63432 Cerebral infarction due to embolism of left posterior cerebral artery: Secondary | ICD-10-CM

## 2018-05-13 LAB — ECHOCARDIOGRAM COMPLETE
Height: 69 in
Weight: 2729.6 oz

## 2018-05-13 MED ORDER — PERFLUTREN LIPID MICROSPHERE
1.0000 mL | INTRAVENOUS | Status: AC | PRN
  Administered 2018-05-13: 2 mL via INTRAVENOUS
  Filled 2018-05-13: qty 10

## 2018-05-13 MED ORDER — STROKE: EARLY STAGES OF RECOVERY BOOK
Freq: Once | Status: AC
  Administered 2018-05-12: 22:00:00

## 2018-05-13 MED ORDER — POLYETHYLENE GLYCOL 3350 17 G PO PACK
17.0000 g | PACK | Freq: Two times a day (BID) | ORAL | Status: DC
  Administered 2018-05-13: 17 g via ORAL
  Filled 2018-05-13: qty 1

## 2018-05-13 NOTE — Consult Note (Signed)
Reason for Consult: stroke  Referring Physician: Dr. Darvin Neighbours  CC: R sided weakness   HPI: Olivia Burton is an 77 y.o. female  with a known history of chronic H fibrillation, history of breast cancer, osteoarthritis who presents to the hospital due to right lower extremity weakness, difficulty using her right hand when trying to text.  Patient says she woke up this morning and was having trouble walking and had to hold onto things as her right leg felt weak.  Pt has hx of a-fib and not compliant with Eliquis.     Past Medical History:  Diagnosis Date  . Breast cancer ([place]) 2012   Right breast  . DCIS (ductal carcinoma in situ)   . Osteoarthritis   . Personal history of radiation therapy     Past Surgical History:  Procedure Laterality Date  . BREAST BIOPSY Right 2012   +  . BREAST LUMPECTOMY Right 2012  . BUNIONECTOMY    . CERVICAL LAMINECTOMY    . ROTATOR CUFF REPAIR      Family History  Problem Relation Age of Onset  . Breast cancer Mother 61  . Coronary artery disease Mother   . Hypertension Mother   . Stroke Mother   . Heart attack Father     Social History:  reports that she has never smoked. She has never used smokeless tobacco. She reports that she does not drink alcohol or use drugs.  Allergies  Allergen Reactions  . Quinine Hives    Medications: I have reviewed the patient's current medications.  ROS: History obtained from the patient  General ROS: negative for - chills, fatigue, fever, night sweats, weight gain or weight loss Psychological ROS: negative for - behavioral disorder, hallucinations, memory difficulties, mood swings or suicidal ideation Ophthalmic ROS: negative for - blurry vision, double vision, eye pain or loss of vision ENT ROS: negative for - epistaxis, nasal discharge, oral lesions, sore throat, tinnitus or vertigo Allergy and Immunology ROS: negative for - hives or itchy/watery eyes Hematological and Lymphatic ROS: negative for -  bleeding problems, bruising or swollen lymph nodes Endocrine ROS: negative for - galactorrhea, hair pattern changes, polydipsia/polyuria or temperature intolerance Respiratory ROS: negative for - cough, hemoptysis, shortness of breath or wheezing Cardiovascular ROS: negative for - chest pain, dyspnea on exertion, edema or irregular heartbeat Gastrointestinal ROS: negative for - abdominal pain, diarrhea, hematemesis, nausea/vomiting or stool incontinence Genito-Urinary ROS: negative for - dysuria, hematuria, incontinence or urinary frequency/urgency Musculoskeletal ROS: negative for - joint swelling or muscular weakness Neurological ROS: as noted in HPI Dermatological ROS: negative for rash and skin lesion changes  Physical Examination: Blood pressure (!) 143/92, pulse (!) 58, temperature 97.6 F (36.4 C), temperature source Oral, resp. rate 20, height 5\' 9"  (1.753 m), weight 170 lb 9.6 oz (77.4 kg), SpO2 99 %.   Neurological Examination   Mental Status: Alert, oriented, thought content appropriate.  Speech fluent without evidence of aphasia.  Able to follow 3 step commands without difficulty. Cranial Nerves: II: Discs flat bilaterally; Visual fields grossly normal, pupils equal, round, reactive to light and accommodation III,IV, VI: ptosis not present, extra-ocular motions intact bilaterally V,VII: smile symmetric, facial light touch sensation normal bilaterally VIII: R facial droop IX,X: gag reflex present XI: bilateral shoulder shrug XII: midline tongue extension Motor: Right : Upper extremity   4/5    Left:     Upper extremity   5/5  Lower extremity   4/5     Lower extremity  5/5 Tone and bulk:normal tone throughout; no atrophy noted Sensory: Pinprick and light touch intact throughout, bilaterally Deep Tendon Reflexes: 2+ and symmetric throughout Plantars: Right: downgoing   Left: downgoing Cerebellar: normal finger-to-nose, normal rapid alternating movements and normal  heel-to-shin test Gait: not tested      Laboratory Studies:   Basic Metabolic Panel: Recent Labs  Lab 05/12/18 1857  NA 143  K 4.0  CL 111  CO2 26  GLUCOSE 98  BUN 14  CREATININE 0.80  CALCIUM 8.7*    Liver Function Tests: Recent Labs  Lab 05/12/18 1857  AST 27  ALT 19  ALKPHOS 66  BILITOT 1.1  PROT 6.7  ALBUMIN 3.8   No results for input(s): LIPASE, AMYLASE in the last 168 hours. No results for input(s): AMMONIA in the last 168 hours.  CBC: Recent Labs  Lab 05/12/18 1857  WBC 3.9  NEUTROABS 1.6  HGB 14.1  HCT 42.6  MCV 90.3  PLT 177    Cardiac Enzymes: Recent Labs  Lab 05/12/18 1857  TROPONINI <0.03    BNP: Invalid input(s): POCBNP  CBG: No results for input(s): GLUCAP in the last 168 hours.  Microbiology: No results found for this or any previous visit.  Coagulation Studies: Recent Labs    05/12/18 1857  LABPROT 14.0  INR 1.09    Urinalysis: No results for input(s): COLORURINE, LABSPEC, PHURINE, GLUCOSEU, HGBUR, BILIRUBINUR, KETONESUR, PROTEINUR, UROBILINOGEN, NITRITE, LEUKOCYTESUR in the last 168 hours.  Invalid input(s): APPERANCEUR  Lipid Panel:  No results found for: CHOL, TRIG, HDL, CHOLHDL, VLDL, LDLCALC  HgbA1C: No results found for: HGBA1C  Urine Drug Screen:  No results found for: LABOPIA, COCAINSCRNUR, LABBENZ, AMPHETMU, THCU, LABBARB  Alcohol Level: No results for input(s): ETH in the last 168 hours.  Other results: EKG:a-fib with rate controlled.  Imaging: Ct Head Wo Contrast  Result Date: 05/12/2018 CLINICAL DATA:  New onset right-sided weakness. EXAM: CT HEAD WITHOUT CONTRAST TECHNIQUE: Contiguous axial images were obtained from the base of the skull through the vertex without intravenous contrast. COMPARISON:  None. FINDINGS: Brain: There is no evidence for acute hemorrhage, hydrocephalus, mass lesion, or abnormal extra-axial fluid collection. No definite CT evidence for acute infarction. Vascular: No hyperdense  vessel or unexpected calcification. Skull: No evidence for fracture. No worrisome lytic or sclerotic lesion. Sinuses/Orbits: The visualized paranasal sinuses and mastoid air cells are clear. Visualized portions of the globes and intraorbital fat are unremarkable. Other: None. IMPRESSION: Normal exam for patient age. Electronically Signed   By: Misty Stanley M.D.   On: 05/12/2018 19:05   Mr Brain Wo Contrast  Result Date: 05/13/2018 CLINICAL DATA:  RIGHT extremity weakness, difficulty texting. Assess for stroke. History of breast cancer. EXAM: MRI HEAD WITHOUT CONTRAST TECHNIQUE: Multiplanar, multiecho pulse sequences of the brain and surrounding structures were obtained without intravenous contrast. COMPARISON:  CT HEAD May 07, 2011 FINDINGS: INTRACRANIAL CONTENTS: 1 cm ovoid reduced diffusion LEFT thalamus with low ADC values and mild FLAIR T2 hyperintense signal. No susceptibility artifact to suggest hemorrhage. Small area RIGHT parietal lobe encephalomalacia. Patchy supratentorial white matter compatible with mild FLAIR chronic T2 hyperintensities small vessel ischemic changes, less than expected for age. The ventricles and sulci are normal for patient's age. No suspicious parenchymal signal, masses, mass effect. No abnormal extra-axial fluid collections. No extra-axial masses. VASCULAR: Normal major intracranial vascular flow voids present at skull base. SKULL AND UPPER CERVICAL SPINE: No abnormal sellar expansion. No suspicious calvarial bone marrow signal. Craniocervical junction maintained. SINUSES/ORBITS: The  mastoid air-cells and included paranasal sinuses are well-aerated.The included ocular globes and orbital contents are non-suspicious. OTHER: None. IMPRESSION: 1. Acute nonhemorrhagic subcentimeter LEFT thalamus infarct. 2. Old small RIGHT parietal/MCA territory infarct. 3. Mild chronic small vessel ischemic changes. Electronically Signed   By: Elon Alas M.D.   On: 05/13/2018 00:09      Assessment/Plan:   77 y.o. female  with a known history of chronic H fibrillation, history of breast cancer, osteoarthritis who presents to the hospital due to right lower extremity weakness, difficulty using her right hand when trying to text.  Patient says she woke up this morning and was having trouble walking and had to hold onto things as her right leg felt weak.  Pt has hx of a-fib and not compliant with Eliquis.    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

## 2018-05-13 NOTE — Progress Notes (Signed)
OT Cancellation Note  Patient Details Name: Olivia Burton MRN: [id number] DOB: 1941-11-27   Cancelled Treatment:    Reason Eval/Treat Not Completed: Other (comment). Order received, chart reviewed. Upon initial attempt to evaluate, pt working with PT. Will re-attempt OT evaluation at later time as pt is available.  Jeni Salles, MPH, MS, OTR/L ascom [phone] 05/13/18, 1:37 PM

## 2018-05-13 NOTE — Care Management Note (Addendum)
Case Management Note  Patient Details  Name: Olivia Burton MRN: [id number] Date of Birth: 05-01-1941  Subjective/Objective:                  Admitted to [facility] under observation status with the diagnosis of CVA. Lives with husband, Toula Moos. [phone]). Last seen Dr. Loney Hering 03/2018. Prescriptions are filled at [facility] in [facility].  No home Health. No skilled nursing. No home oxygen. No medical equipment in the home. Takes care of all basic activities of daily living herself, drives. No falls. Good appetite. Family will transport  Action/Plan: Will continue to follow for discharge plans   Expected Discharge Date:  05/13/18               Expected Discharge Plan:     In-House Referral:     Discharge planning Services     Post Acute Care Choice:    Choice offered to:     DME Arranged:    DME Agency:     HH Arranged:    [place] Agency:     Status of Service:     If discussed at [employer], dates discussed:    Additional Comments:  Shelbie Ammons, RN MSN CCM Care Management [phone] 05/13/2018, 11:01 AM

## 2018-05-13 NOTE — Care Management Obs Status (Signed)
[place] NOTIFICATION   Patient Details  Name: MARYBEL ALCOTT MRN: [id number] Date of Birth: [birth year]   Medicare Observation Status Notification Given:  Yes    Shelbie Ammons, RN 05/13/2018, 11:14 AM

## 2018-05-13 NOTE — Evaluation (Signed)
Physical Therapy Evaluation Patient Details Name: Olivia Burton MRN: [id number] DOB: [birth year] Today's Date: 05/13/2018   History of Present Illness  Pt is a76 y.o.femalewith a known history of chronic atrial fibrillation, history of breast cancer, osteoarthritis who presents to the hospital due to right lower extremity weakness, difficulty using her right hand when trying to text. Patient says she woke and was having trouble walking and had to hold onto things as her right leg felt weak. She was having also some trouble texting as she was having difficulty controlling her right arm. Because her symptoms got worse she came to the ER for further evaluation. Patient continues to have significant right-sided dysmetria on the upper extremity with some right lower extremity weakness. 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     Clinical Impression  Pt presents with mild deficits in RLE strength, transfers, mobility, gait, balance, and activity tolerance.  Pt presented with mild deficits with RUE finger to nose, RAMs, and sequential thumb to finger opposition compared to the LUE.  RLE hip flex, knee flex, and knee ext strength deficits noted compared to the LLE per below.  Sensation to light touch and proprioception grossly intact and equal to BLEs.  Pt with min instability during amb without an AD per below which improved notably when pt ambulated with a RW.  Pt steady up/down steps with B rails but required extensive verbal cues for proper sequencing with education provided to pt, pt's son, and pt's spouse.  Pt will benefit from HHPT services upon discharge to safely address above deficits for decreased caregiver assistance and eventual return to PLOF.      Follow Up Recommendations Home health PT;Supervision for mobility/OOB    Equipment Recommendations  Rolling walker with 5" wheels    Recommendations for Other Services        Precautions / Restrictions Precautions Precautions: Fall Restrictions Weight Bearing Restrictions: No      Mobility  Bed Mobility Overal bed mobility: Modified Independent             General bed mobility comments: Increased time and effort but no physical assistance required  Transfers Overall transfer level: Needs assistance Equipment used: None;Rolling walker (2 wheeled) Transfers: Sit to/from Stand Sit to Stand: Supervision         General transfer comment: Pt steady with good eccentric and concentric control during transfers  Ambulation/Gait Ambulation/Gait assistance: Supervision Ambulation Distance (Feet): 150 Feet Assistive device: None;Rolling walker (2 wheeled) Gait Pattern/deviations: Step-through pattern;Decreased step length - right;Decreased step length - left;Drifts right/left Gait velocity: Decreased   General Gait Details: Pt with mildly varying gait speed and drifting left/right during amb without an AD; pt ambulated with more consistant cadence with improved stability with a RW  Stairs Stairs: Yes Stairs assistance: Min guard Stair Management: Two rails Number of Stairs: 4 General stair comments: Ascended/descended 4 steps x 2 with B rails with mod verbal and visual cues for proper sequencing; pt's spouse and son present for stair training education  Wheelchair Mobility    Modified Rankin (Stroke Patients Only)       Balance Overall balance assessment: Mild deficits observed, not formally tested(SLS time: RLE 10 sec, LLE 5 sec; good stability with feet apart and fair stability with feet together with eyes closed)  Pertinent Vitals/Pain Pain Assessment: No/denies pain    Home Living Family/patient expects to be discharged to:: Private residence Living Arrangements: Spouse/significant other Available Help at Discharge: Family;Available PRN/intermittently Type of Home:  House Home Access: Stairs to enter Entrance Stairs-Rails: None Entrance Stairs-Number of Steps: 1 step x 2 without rails to enter home Home Layout: Two level;Bed/bath upstairs Home Equipment: None      Prior Function Level of Independence: Independent         Comments: Ind Amb community distances without AD, no fall history, Ind with ADLs     Hand Dominance   Dominant Hand: Right    Extremity/Trunk Assessment   Upper Extremity Assessment Upper Extremity Assessment: Defer to OT evaluation;RUE deficits/detail RUE Deficits / Details: Min deficits compared to the LUE with finger to nose, alternating thumb to finger opposition, and RAMs    Lower Extremity Assessment Lower Extremity Assessment: RLE deficits/detail RLE Deficits / Details: RLE hip flex, knee flex, and knee ext all 3+/5 to 4/5 compared to 5/5 on the LLE RLE Sensation: WNL(Light touch and proprioception intact and equal to BLEs) RLE Coordination: decreased gross motor(Heel to shin slightly decreased on the RLE compared to the LLE)    Cervical / Trunk Assessment Cervical / Trunk Assessment: Normal  Communication   Communication: No difficulties  Cognition Arousal/Alertness: Awake/alert Behavior During Therapy: WFL for tasks assessed/performed Overall Cognitive Status: Within Functional Limits for tasks assessed                                        General Comments      Exercises Total Joint Exercises Ankle Circles/Pumps: Strengthening;Both;10 reps Heel Slides: AROM;Both;5 reps Straight Leg Raises: Strengthening;Both;5 reps Long Arc Quad: Strengthening;Both;5 reps Knee Flexion: Strengthening;Both;5 reps Marching in Standing: AROM;Both;5 reps Other Exercises Other Exercises: static standing balance training with feet apart and together with combinations of eyes open/closed and head still/head turns   Assessment/Plan    PT Assessment Patient needs continued PT services  PT Problem  List Decreased strength;Decreased activity tolerance;Decreased balance;Decreased mobility;Decreased knowledge of use of DME       PT Treatment Interventions DME instruction;Gait training;Stair training;Functional mobility training;Balance training;Therapeutic exercise;Therapeutic activities;Patient/family education;Neuromuscular re-education    PT Goals (Current goals can be found in the Care Plan section)  Acute Rehab PT Goals Patient Stated Goal: To walk better without dragging my feet PT Goal Formulation: With patient Time For Goal Achievement: 05/26/18 Potential to Achieve Goals: Good    Frequency 7X/week   Barriers to discharge        Co-evaluation               AM-PAC PT "6 Clicks" Daily Activity  Outcome Measure Difficulty turning over in bed (including adjusting bedclothes, sheets and blankets)?: A Little Difficulty moving from lying on back to sitting on the side of the bed? : A Little Difficulty sitting down on and standing up from a chair with arms (e.g., wheelchair, bedside commode, etc,.)?: A Little Help needed moving to and from a bed to chair (including a wheelchair)?: A Little Help needed walking in hospital room?: A Little Help needed climbing 3-5 steps with a railing? : A Little 6 Click Score: 18    End of Session Equipment Utilized During Treatment: Gait belt Activity Tolerance: Patient tolerated treatment well Patient left: in bed;with call bell/phone within reach;with family/visitor present(Pt declined up in chair) Nurse Communication: Mobility  status PT Visit Diagnosis: Unsteadiness on feet (R26.81);Difficulty in walking, not elsewhere classified (R26.2);Muscle weakness (generalized) (M62.81)    Time: 1320(also 10:55-11:05 with pt taken for imaging)-1415 PT Time Calculation (min) (ACUTE ONLY): 55 min   Charges:   PT Evaluation $PT Eval Moderate Complexity: 1 Mod PT Treatments $Gait Training: 8-22 mins $Therapeutic Exercise: 8-22 mins   PT G  Codes:        DRoyetta Asal PT, DPT 05/13/18, 2:47 PM

## 2018-05-13 NOTE — Care Management (Signed)
Physical therapy evaluation completed. Recommending home with home health/physical therapy. Olivia Burton lives in [place], [place] call to [facility]. Will accept [name] as a patient. Referrals faxed to [phone]. Discharge to home today per Dr.Sudini.  Shelbie Ammons RN MSN CCM Care Management [phone]

## 2018-05-13 NOTE — Evaluation (Signed)
Occupational Therapy Evaluation Patient Details Name: Olivia Burton MRN: [id number] DOB: 1941-08-12 Today's Date: 05/13/2018    History of Present Illness Pt is a76 y.o.femalewith a known history of chronic atrial fibrillation, history of breast cancer, osteoarthritis who presents to the hospital due to right lower extremity weakness, difficulty using her right hand when trying to text. Patient says she woke and was having trouble walking and had to hold onto things as her right leg felt weak. She was having also some trouble texting as she was having difficulty controlling her right arm. Because her symptoms got worse she came to the ER for further evaluation. Patient continues to have significant right-sided dysmetria on the upper extremity with some right lower extremity weakness. 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    Clinical Impression   Pt seen for OT evaluation this date. Prior to hospital admission, pt was independent in all aspects of ADL/IADL and mobility. Pt lives in [place] with her spouse and denies falls in past 12 months. Currently pt demonstrates R sided impairments in strength and coordination, balance, and safety awareness. No visual, sensation, or language deficits appreciated with assessment. Pt/family instructed in fine motor coordination exercises to perform and pt able to return demo, spouse verbalizes understanding. Pt able to perform transfers at supervision to CGA level of assist with 1 mild LOB noted when initially descending to transfer to regular toilet, but was able to self correct quickly. Due to impairments, pt requires supervision to [place] assist for LB ADL tasks during transitional movements and demonstrates difficulty with IADL tasks including writing. Pt will benefit from skilled OT services to address noted impairments and functional deficits in order to maximize return to PLOF and minimize risk of falls  and caregiver burden. Recommend initial [place] services with eventual progression to OP OT services as appropriate.     Follow Up Recommendations  Home health OT;Supervision - Intermittent(sup OOB)    Equipment Recommendations  None recommended by OT    Recommendations for Other Services       Precautions / Restrictions Precautions Precautions: Fall Restrictions Weight Bearing Restrictions: No      Mobility Bed Mobility Overal bed mobility: Modified Independent             General bed mobility comments: Increased time and effort but no physical assistance required  Transfers Overall transfer level: Needs assistance Equipment used: None Transfers: Sit to/from Stand Sit to Stand: Supervision         General transfer comment: Pt steady with good eccentric and concentric control during transfers    Balance Overall balance assessment: Mild deficits observed, not formally tested(slight LOB during toilet transfer but able to self correct)                                         ADL either performed or assessed with clinical judgement   ADL Overall ADL's : Needs assistance/impaired Eating/Feeding: Independent   Grooming: Sitting;Independent   Upper Body Bathing: Sitting;Supervision/ safety   Lower Body Bathing: Sit to/from stand;Supervison/ safety   Upper Body Dressing : Sitting;Supervision/safety   Lower Body Dressing: Sit to/from stand;Supervision/safety   Toilet Transfer: Ambulation;Regular Toilet;Min guard Toilet Transfer Details (indicate cue type and reason): slight LOB just prior to sitting, able to self correct Toileting- Clothing Manipulation and Hygiene: Sitting/lateral lean;Independent  Functional mobility during ADLs: Min guard       Vision Baseline Vision/History: Wears glasses Wears Glasses: Reading only Patient Visual Report: No change from baseline Vision Assessment?: No apparent visual deficits     Perception      Praxis      Pertinent Vitals/Pain Pain Assessment: No/denies pain     Hand Dominance Right   Extremity/Trunk Assessment Upper Extremity Assessment Upper Extremity Assessment: RUE deficits/detail(LUE shoulder flexion 4-/5, all else 4+/5, intact coordination and sensation) RUE Deficits / Details: shoulder flexion 3+/5, all else 4+/5, good grip and pinch strength; impaired coordination with finger to nose, thumb opposition, and RAM testing. No pronator drift. Intact sensation. RUE Coordination: decreased fine motor   Lower Extremity Assessment Lower Extremity Assessment: Defer to PT evaluation;RLE deficits/detail RLE Deficits / Details: RLE hip flex, knee flex, and knee ext all 3+/5 to 4/5 compared to 5/5 on the LLE RLE Sensation: WNL RLE Coordination: decreased gross motor   Cervical / Trunk Assessment Cervical / Trunk Assessment: Normal   Communication Communication Communication: No difficulties   Cognition Arousal/Alertness: Awake/alert Behavior During Therapy: WFL for tasks assessed/performed Overall Cognitive Status: Within Functional Limits for tasks assessed                                     General Comments       Exercises Other Exercises Other Exercises: Pt/family instructed in fine motor coordination exercises    Shoulder Instructions      Home Living Family/patient expects to be discharged to:: Private residence Living Arrangements: Spouse/significant other Available Help at Discharge: Family;Available PRN/intermittently Type of Home: House Home Access: Stairs to enter [employer] of Steps: 1 step x 2 without rails to enter home Entrance Stairs-Rails: None Home Layout: Two level;Bed/bath upstairs Alternate Level Stairs-Number of Steps: 13 Alternate Level Stairs-Rails: Left     Bathroom Toilet: Standard     Home Equipment: None          Prior Functioning/Environment Level of Independence: Independent         Comments: Ind Amb community distances without AD, no fall history, Ind with ADLs        OT Problem List: Impaired balance (sitting and/or standing);Impaired UE functional use;Decreased coordination      OT Treatment/Interventions: Self-care/ADL training;Balance training;Therapeutic exercise;Neuromuscular education;Therapeutic activities;Patient/family education    OT Goals(Current goals can be found in the care plan section) Acute Rehab OT Goals Patient Stated Goal: to be able to write well again OT Goal Formulation: With patient/family Time For Goal Achievement: 05/27/18 Potential to Achieve Goals: Good ADL Goals Pt Will Perform Lower Body Dressing: with modified independence;sit to/from stand Pt Will Transfer to Toilet: with modified independence;ambulating Pt/caregiver will Perform Home Exercise Program: Right Upper extremity;Independently;With written HEP provided(FMC exercises for R hand)  OT Frequency: Min 1X/week   Barriers to D/C:            Co-evaluation              AM-PAC PT "6 Clicks" Daily Activity     Outcome Measure Help from another person eating meals?: None Help from another person taking care of personal grooming?: None Help from another person toileting, which includes using toliet, bedpan, or urinal?: A Little Help from another person bathing (including washing, rinsing, drying)?: A Little Help from another person to put on and taking off regular upper body clothing?: None Help from another person  to put on and taking off regular lower body clothing?: A Little 6 Click Score: 21   End of Session    Activity Tolerance: Patient tolerated treatment well Patient left: in bed;with call bell/phone within reach;with bed alarm set  OT Visit Diagnosis: Unsteadiness on feet (R26.81);Hemiplegia and hemiparesis Hemiplegia - Right/Left: Right Hemiplegia - dominant/non-dominant: Dominant Hemiplegia - caused by: Cerebral infarction                Time:  4163-8453 OT Time Calculation (min): 19 min Charges:  OT General Charges $OT Visit: 1 Visit OT Evaluation $OT Eval Low Complexity: 1 Low OT Treatments $Therapeutic Exercise: 8-22 mins  Jeni Salles, MPH, MS, OTR/L ascom [phone] 05/13/18, 4:40 PM

## 2018-05-13 NOTE — Plan of Care (Signed)
  Problem: Education: Goal: Knowledge of disease or condition will improve Outcome: Progressing Goal: Knowledge of secondary prevention will improve Outcome: Progressing Goal: Knowledge of patient specific risk factors addressed and post discharge goals established will improve Outcome: Progressing   Problem: Coping: Goal: Will identify appropriate support needs Outcome: Progressing   Problem: Health Behavior/Discharge Planning: Goal: Ability to manage health-related needs will improve Outcome: Progressing   Problem: Self-Care: Goal: Ability to participate in self-care as condition permits will improve Outcome: Progressing Goal: Ability to communicate needs accurately will improve Outcome: Progressing   Problem: Nutrition: Goal: Risk of aspiration will decrease Outcome: Progressing   Problem: Ischemic Stroke/TIA Tissue Perfusion: Goal: Complications of ischemic stroke/TIA will be minimized Outcome: Progressing   Problem: Education: Goal: Knowledge of General Education information will improve Outcome: Progressing   Problem: Health Behavior/Discharge Planning: Goal: Ability to manage health-related needs will improve Outcome: Progressing   Problem: Clinical Measurements: Goal: Ability to maintain clinical measurements within normal limits will improve Outcome: Progressing Goal: Will remain free from infection Outcome: Progressing Goal: Diagnostic test results will improve Outcome: Progressing Goal: Respiratory complications will improve Outcome: Progressing Goal: Cardiovascular complication will be avoided Outcome: Progressing   Problem: Activity: Goal: Risk for activity intolerance will decrease Outcome: Progressing   Problem: Nutrition: Goal: Adequate nutrition will be maintained Outcome: Progressing   Problem: Coping: Goal: Level of anxiety will decrease Outcome: Progressing   Problem: Elimination: Goal: Will not experience complications related to  bowel motility Outcome: Progressing Goal: Will not experience complications related to urinary retention Outcome: Progressing   Problem: Pain Managment: Goal: General experience of comfort will improve Outcome: Progressing   Problem: Safety: Goal: Ability to remain free from injury will improve Outcome: Progressing   Problem: Skin Integrity: Goal: Risk for impaired skin integrity will decrease Outcome: Progressing

## 2018-05-13 NOTE — Discharge Instructions (Signed)
Heart healthy diet. °Activity as tolerated. °

## 2018-05-13 NOTE — Progress Notes (Signed)
*  PRELIMINARY RESULTS* Echocardiogram 2D Echocardiogram has been performed.  Olivia Burton 05/13/2018, 12:44 PM

## 2018-05-13 NOTE — Progress Notes (Signed)
Pt for discharge home. A/o. No resp distress. Instructions discussed with pt regarding meds for home. No new meds. /diet/ activity and f/u discussed. Verbalized understanding of all discharge plans. Home via w/o to front with husband w/o c/o.

## 2018-06-01 NOTE — Discharge Summary (Signed)
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 Breast cancer ([place]) 2012   Right breast  . DCIS (ductal carcinoma in situ)   . Osteoarthritis   . Personal history of radiation therapy      ADMITTING HISTORY  HISTORY OF PRESENT ILLNESS:  Olivia Burton  is a 77 y.o. female with a known history of chronic H fibrillation, history of breast cancer, osteoarthritis who presents to the hospital due to right lower extremity weakness, difficulty using her right hand when trying to text.  Patient says she woke up this morning and was having trouble walking and had to hold onto things as her right leg felt weak.  She was having also some trouble texting the day before yesterday as she was having difficulty controlling her right arm.  Because her symptoms got worse this morning she came to the ER for further evaluation.  Patient continues to have significant right-sided dysmetria on the upper extremity with some right lower extremity weakness.  Patient CT head is negative for acute CVA but given her persistent symptoms hospitalist services were contacted for admission.  Patient denies any headache, nausea, vomiting, chest pain, shortness of breath, fever, chills, cough or any other associated symptoms presently.  HOSPITAL COURSE:   * Acute nonhemorrhagic subcentimeter LEFT thalamus infarct Patient admitted to medical floor with telemetry monitoring with every 4 hours neurochecks.  Symptoms slowly improved.  By the day of discharge she continues to have mild  right-sided residual weakness.  But with physical therapy and Occupational Therapy.  Recommended outpatient physical and occupational therapy that have been set up. Patient was noncompliant with Eliquis prior to admission.  Counseled extensively.  Statin added.  Patient seen by neurology.  Echocardiogram and carotid Dopplers with no significant findings.  With improving symptoms and complete that work-up patient is being discharged home to follow-up with primary care physician as outpatient.  CONSULTS OBTAINED:  Treatment Team:  Leotis Pain, MD  DRUG ALLERGIES:   Allergies  Allergen Reactions  . Quinine Hives    DISCHARGE MEDICATIONS:   Allergies as of 05/13/2018      Reactions   Quinine Hives      Medication List    TAKE these medications   Biotin 1000 MCG tablet Take 1,000 mcg by mouth daily.   calcium carbonate 500 MG chewable tablet Commonly known as:  TUMS - dosed in mg elemental calcium Chew 1 tablet by mouth daily.   CO ENZYME Q-10 PO Take 1 capsule by mouth daily.   ELIQUIS 5 MG Tabs tablet Generic drug:  apixaban Take 5 mg by mouth 2 (two) times daily.   latanoprost 0.005 % ophthalmic solution Commonly known as:  XALATAN Place 1 drop into both eyes at bedtime.   MULTI-VITAMINS Tabs Take 1 tablet by mouth daily.   Red Yeast Rice 600 MG Tabs Take 2 tablets by mouth daily.   rosuvastatin 10 MG tablet Commonly known as:  CRESTOR Take 10 mg by mouth daily.   SIMBRINZA 1-0.2 % Susp Generic drug:  Brinzolamide-Brimonidine  INSTILL 1 DROP INTO BOTH EYES TWICE A DAY   SM BALANCED B-100 Tabs Take 1 tablet by mouth daily.   VITAMIN B 12 PO Take 1 tablet by mouth daily.   vitamin C 500 MG tablet Commonly known as:  ASCORBIC ACID Take 500 mg by mouth daily.       Today   VITAL SIGNS:  Blood pressure (!) 158/101, pulse 88, temperature 97.6 F (36.4 C), temperature source Oral, resp. rate 20, height 5\' 9"  (1.753 m), weight 77.4 kg (170 lb 9.6  oz), SpO2 97 %.  I/O:  No intake or output data in the 24 hours ending 06/01/18 1452  PHYSICAL EXAMINATION:  Physical Exam  GENERAL:  77 y.o.-year-old patient lying in the bed with no acute distress.  LUNGS: Normal breath sounds bilaterally, no wheezing, rales,rhonchi or crepitation. No use of accessory muscles of respiration.  CARDIOVASCULAR: S1, S2 normal. No murmurs, rubs, or gallops.  ABDOMEN: Soft, non-tender, non-distended. Bowel sounds present. No organomegaly or mass.  NEUROLOGIC: Mild right upper and lower extremity weakness PSYCHIATRIC: The patient is alert and oriented x 3.  SKIN: No obvious rash, lesion, or ulcer.   DATA REVIEW:   CBC No results for input(s): WBC, HGB, HCT, PLT in the last 168 hours.  Chemistries  No results for input(s): NA, K, CL, CO2, GLUCOSE, BUN, CREATININE, CALCIUM, MG, AST, ALT, ALKPHOS, BILITOT in the last 168 hours.  Invalid input(s): GFRCGP  Cardiac Enzymes No results for input(s): TROPONINI in the last 168 hours.  Microbiology Results  No results found for this or any previous visit.  RADIOLOGY:  No results found.  Follow up with PCP in 1 week.  Management plans discussed with the patient, family and they are in agreement.  CODE STATUS:  Code Status History    Date Active Date Inactive Code Status Order ID Comments User Context   05/12/2018 2143 05/13/2018 2105 Full Code [id number]  Henreitta Leber, MD Inpatient    Advance Directive Documentation     Most Recent Value  Type of Advance Directive  Healthcare Power of Attorney, Living will  Pre-existing out of facility DNR order (yellow form or pink MOST form)  -  "MOST" Form in Place?  -      TOTAL TIME TAKING CARE OF THIS PATIENT ON DAY OF DISCHARGE: more than 30 minutes.   Neita Carp M.D on 06/01/2018 at 2:52 PM  Between 7am to 6pm - Pager - [phone]  After 6pm go to [email or web address] - password [email or web address] [facility]  Office   [phone]  CC: Primary care physician; Derinda Late, MD  Note: This dictation was prepared with Dragon dictation along with smaller phrase technology. Any transcriptional errors that result from this process are unintentional.

## 2018-10-06 DIAGNOSIS — E119 Type 2 diabetes mellitus without complications: Secondary | ICD-10-CM | POA: Insufficient documentation

## 2019-03-22 ENCOUNTER — Other Ambulatory Visit: Payer: Self-pay | Admitting: Family Medicine

## 2019-03-22 DIAGNOSIS — Z1231 Encounter for screening mammogram for malignant neoplasm of breast: Secondary | ICD-10-CM

## 2019-05-23 ENCOUNTER — Ambulatory Visit
Admission: RE | Admit: 2019-05-23 | Discharge: 2019-05-23 | Disposition: A | Discharge status: Home / Self Care | Payer: Medicare Other | Source: Ambulatory Visit | Attending: Family Medicine | Admitting: Family Medicine

## 2019-05-23 ENCOUNTER — Other Ambulatory Visit: Payer: Self-pay

## 2019-05-23 DIAGNOSIS — Z1231 Encounter for screening mammogram for malignant neoplasm of breast: Secondary | ICD-10-CM

## 2019-06-19 IMAGING — CT CT HEAD W/O CM
3 of 4 series · 16 of 47 positions shown, 19 images · non-contrast
Comparison: None.

CLINICAL DATA: New onset right-sided weakness.

EXAM:
CT HEAD WITHOUT CONTRAST
TECHNIQUE: Contiguous axial images were obtained from the base of the skull
through the vertex without intravenous contrast.

[Series 3: head wo · axial · 0.43mm/px · z∈[-124,+6]mm · 10 of 30 slices shown, 13 images]
[im 2/30  brain]
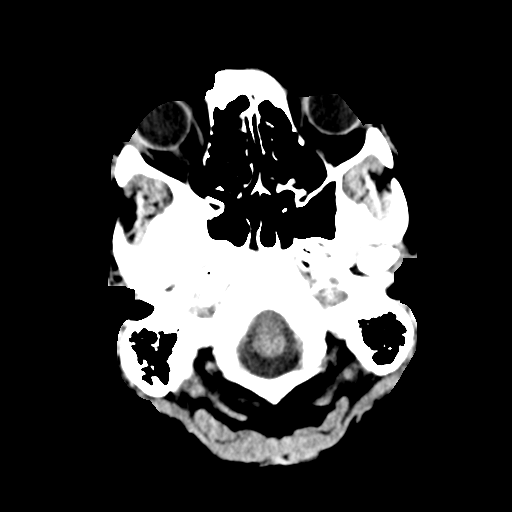
[im 2/30  bone]
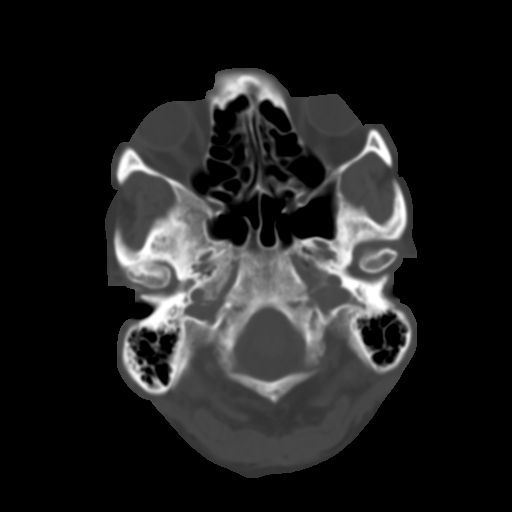
[im 6/30  brain]
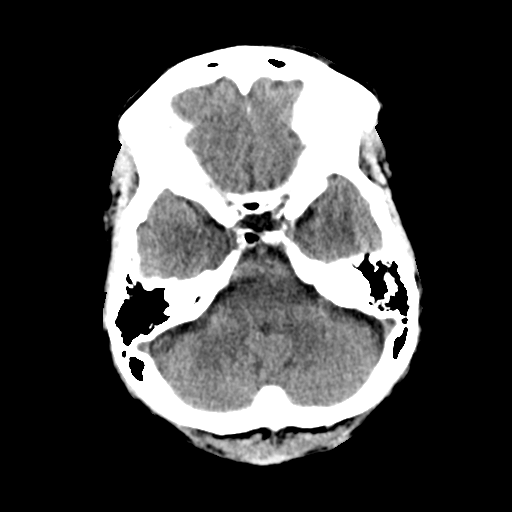
[im 8/30  brain]
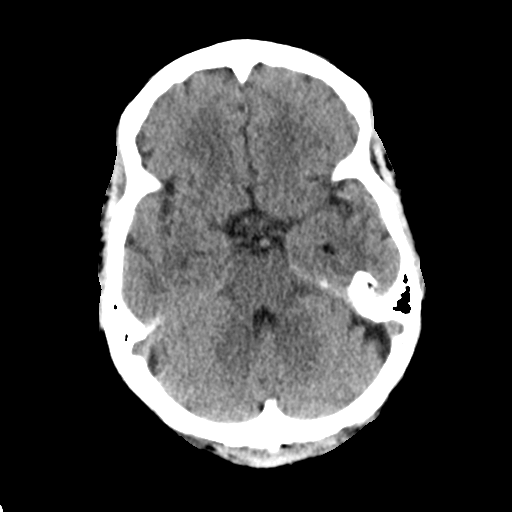
[im 10/30  brain]
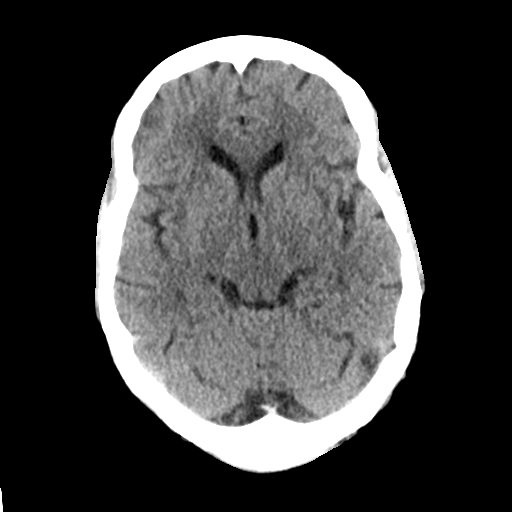
[im 14/30  brain]
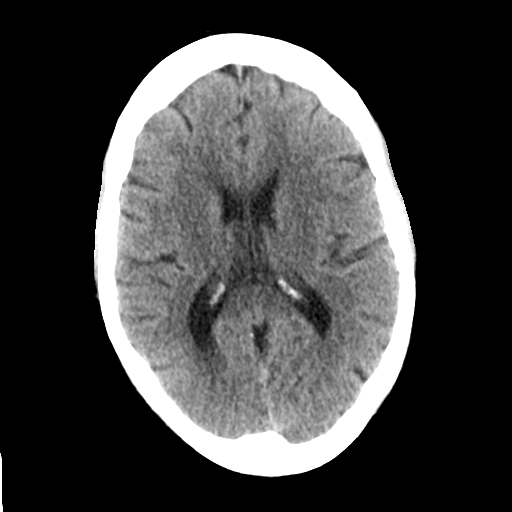
[im 14/30  bone]
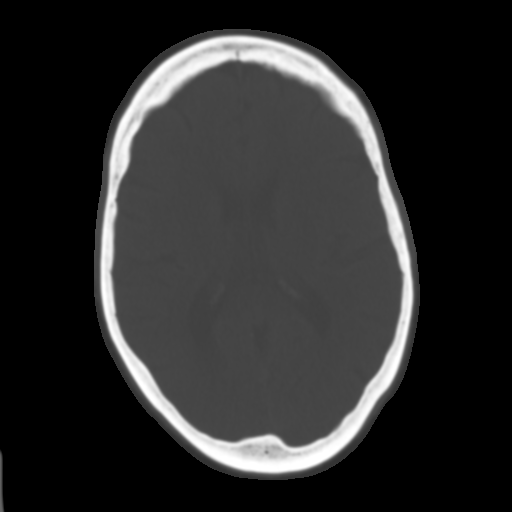
[im 16/30  brain]
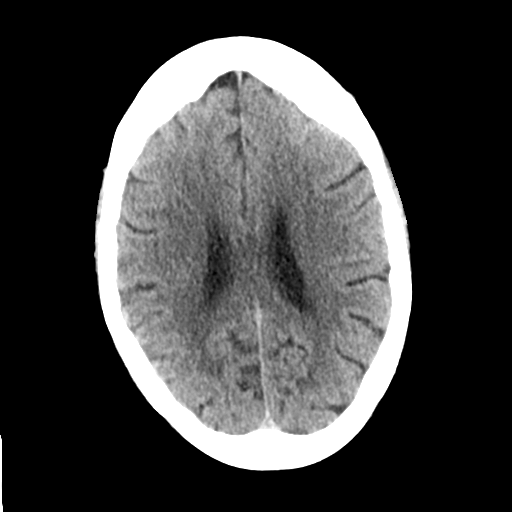
[im 20/30  brain]
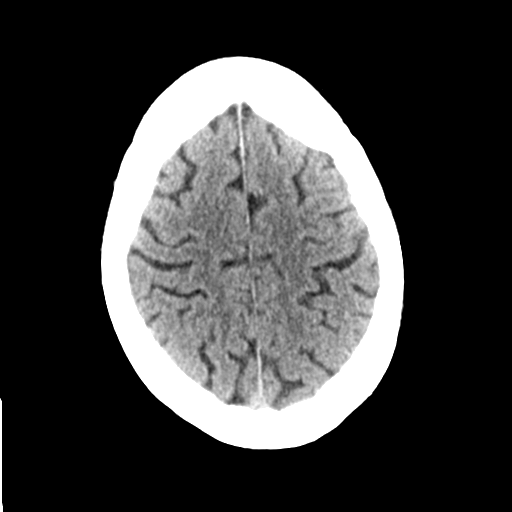
[im 22/30  brain]
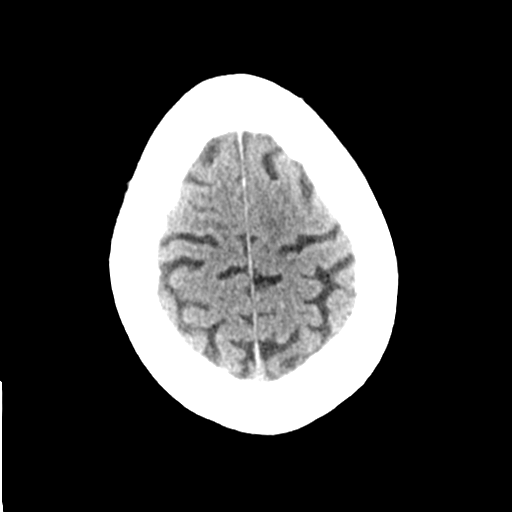
[im 24/30  brain]
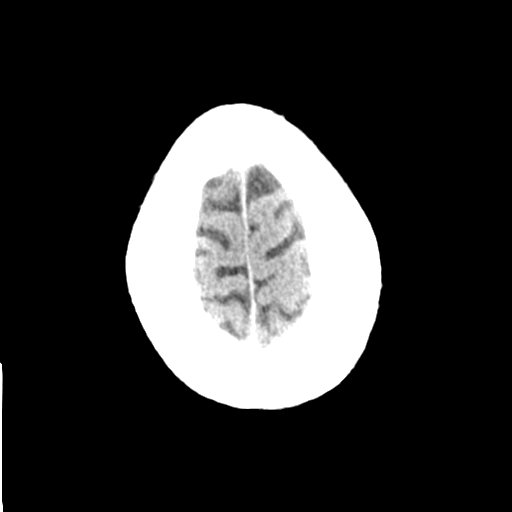
[im 24/30  bone]
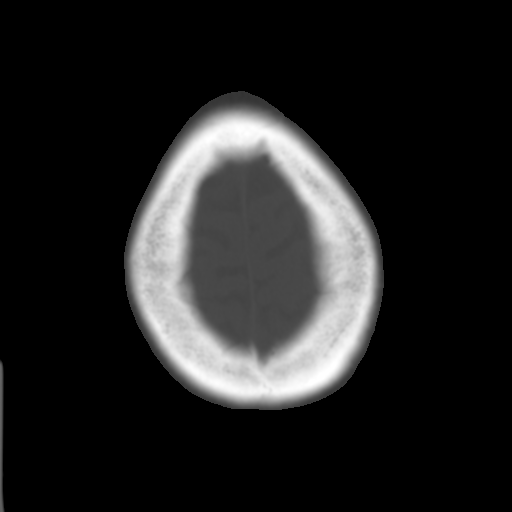
[im 28/30  brain]
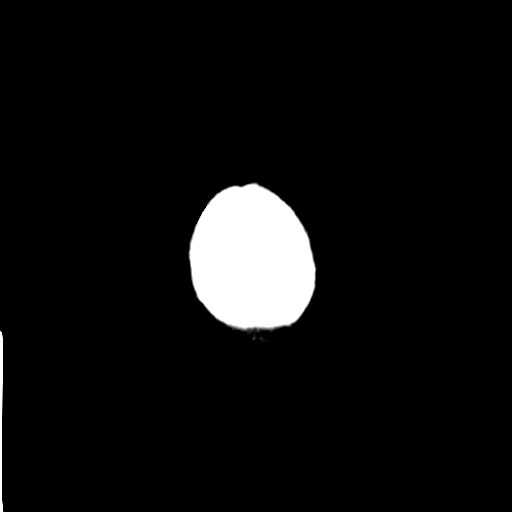

[Series 4: coronal soft tissue · coronal · 0.31mm/px · 3 of 63 slices shown]
[im 21/63  brain]
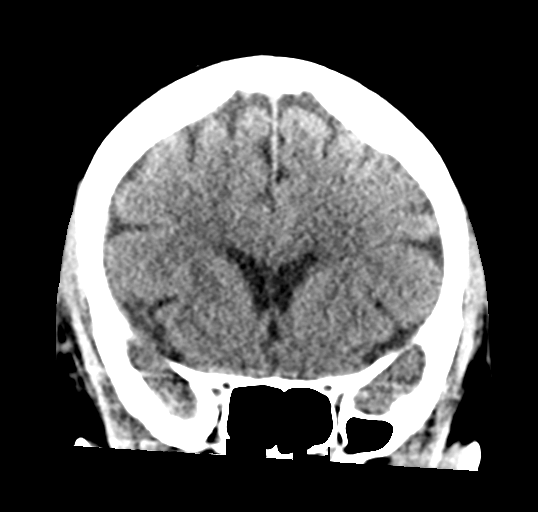
[im 28/63  brain]
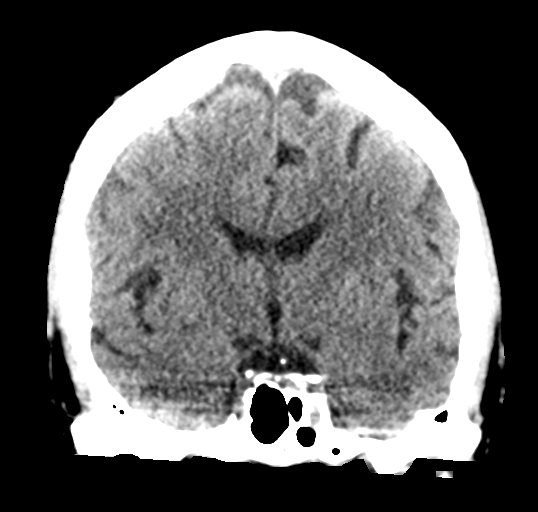
[im 35/63  brain]
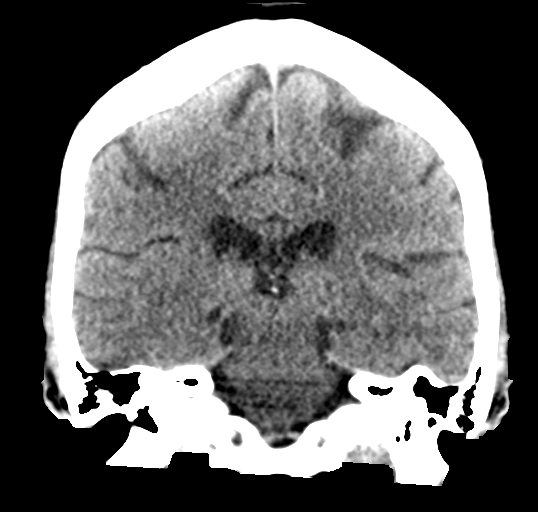

[Series 5: sagittal soft tissue · sagittal · 0.31mm/px · 3 of 49 slices shown]
[im 17/49  brain]
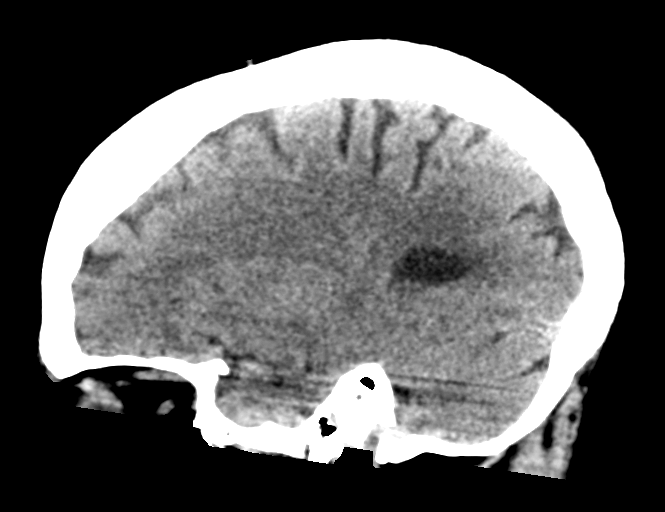
[im 25/49  brain]
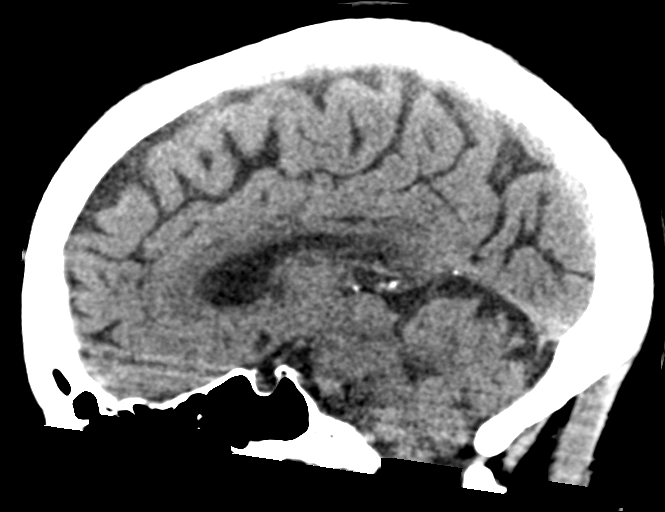
[im 33/49  brain]
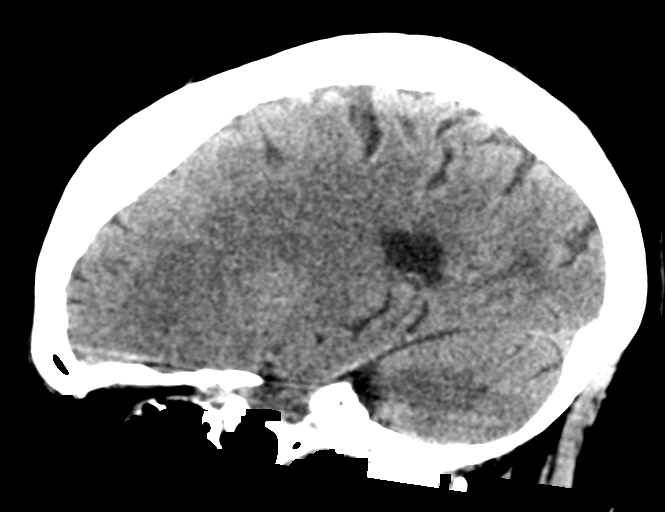

[16 of 47 positions shown; findings below may reference images not displayed]

FINDINGS: Brain: There is no evidence for acute hemorrhage, hydrocephalus,
mass lesion, or abnormal extra-axial fluid collection. No definite
CT evidence for acute infarction.

Vascular: No hyperdense vessel or unexpected calcification.

Skull: No evidence for fracture. No worrisome lytic or sclerotic
lesion.

Sinuses/Orbits: The visualized paranasal sinuses and mastoid air
cells are clear. Visualized portions of the globes and intraorbital
fat are unremarkable.

Other: None.
IMPRESSION: Normal exam for patient age.

## 2019-06-20 IMAGING — US US CAROTID DUPLEX BILAT
1 series · 13 of 24 positions shown · non-contrast
Comparison: None.

CLINICAL DATA: Stroke.  Right lower extremity weakness.

EXAM:
BILATERAL CAROTID DUPLEX ULTRASOUND
TECHNIQUE: Gray scale imaging, color Doppler and duplex ultrasound was
performed of bilateral carotid and vertebral arteries in the neck.
TECHNIQUE: Quantification of carotid stenosis is based on velocity parameters
that correlate the residual internal carotid diameter with
NASCET-based stenosis levels, using the diameter of the distal
internal carotid lumen as the denominator for stenosis measurement.

[Series 1: us carotid duplex bilat · 13 of 68 slices shown]
[im 1/68]
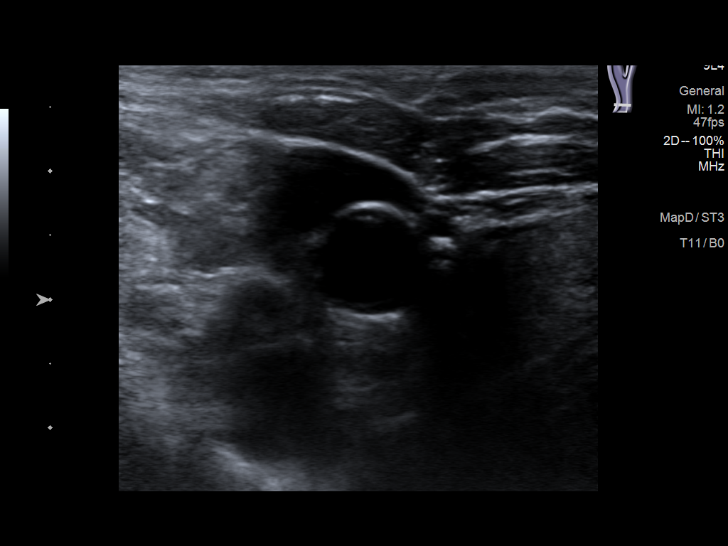
[im 6/68]
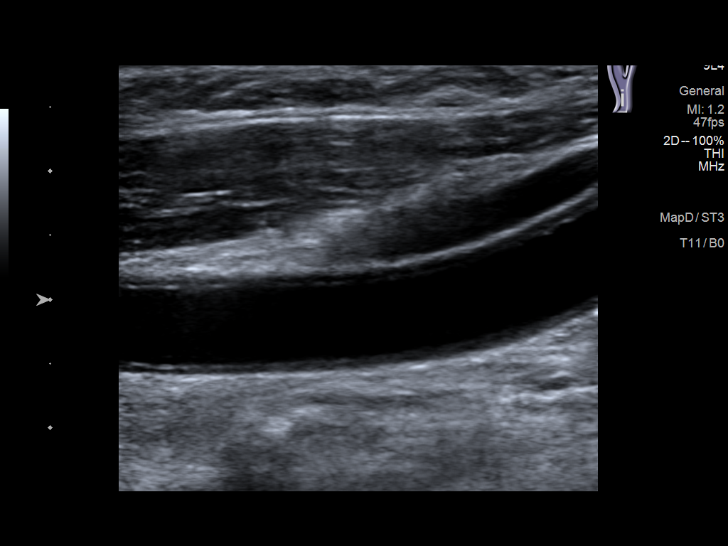
[im 12/68]
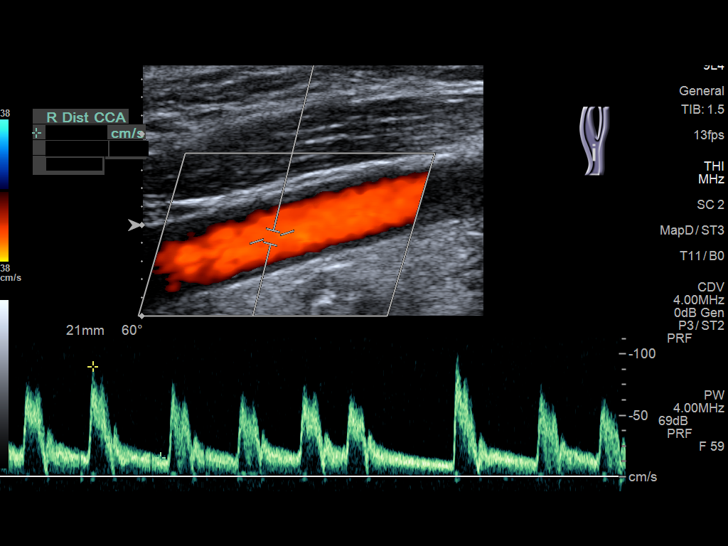
[im 18/68]
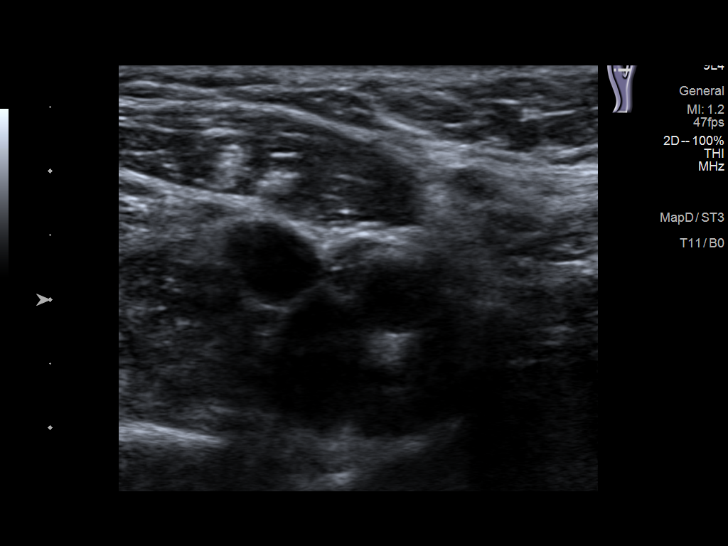
[im 24/68]
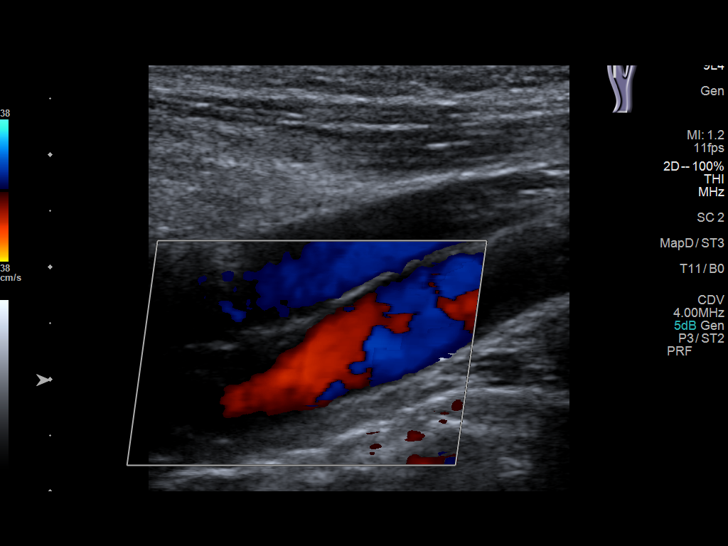
[im 30/68]
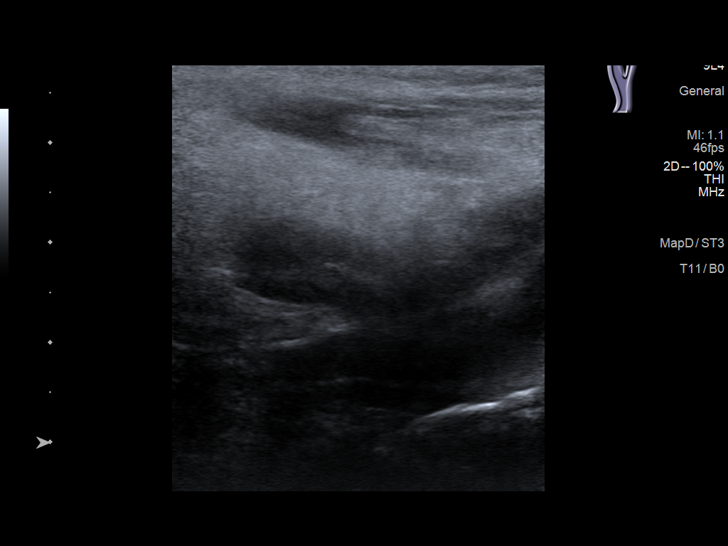
[im 35/68]
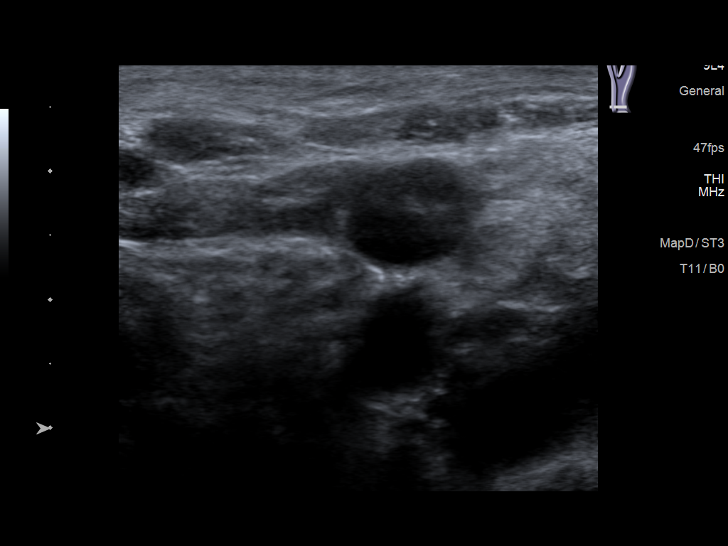
[im 38/68]
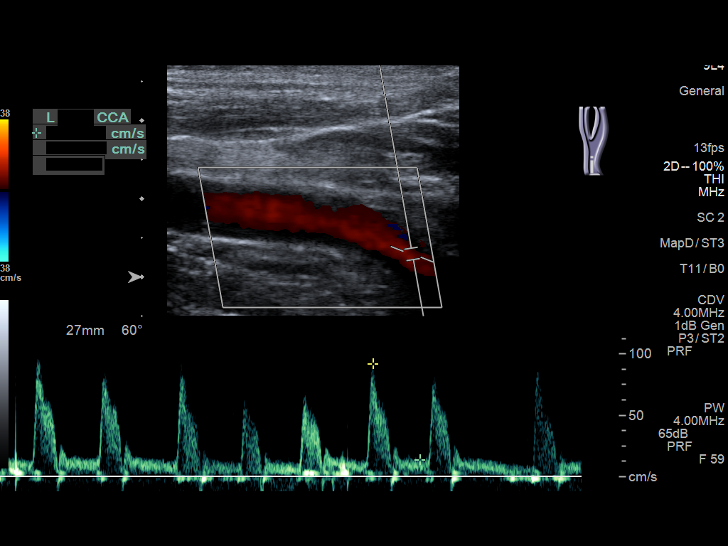
[im 44/68]
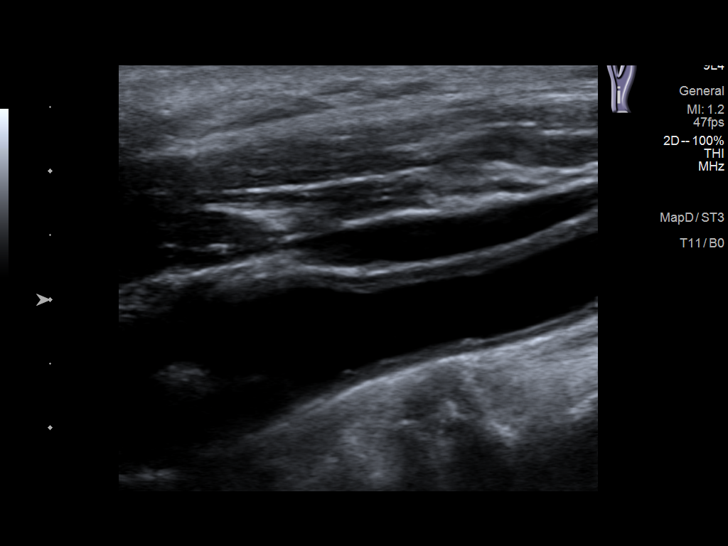
[im 50/68]
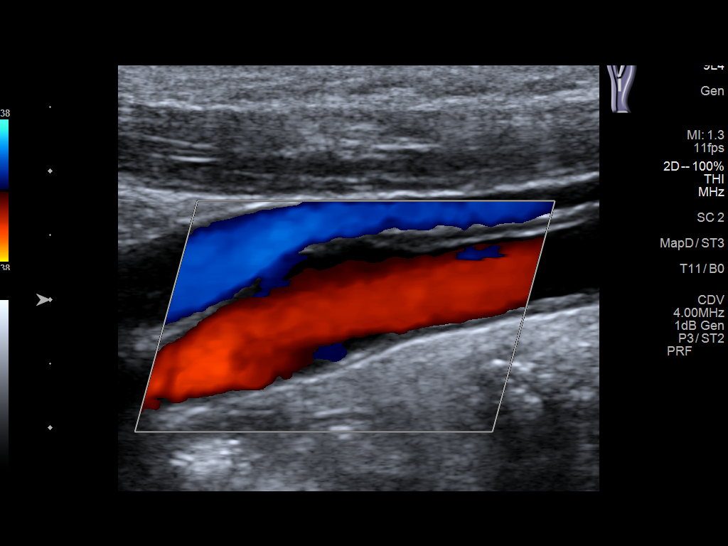
[im 56/68]
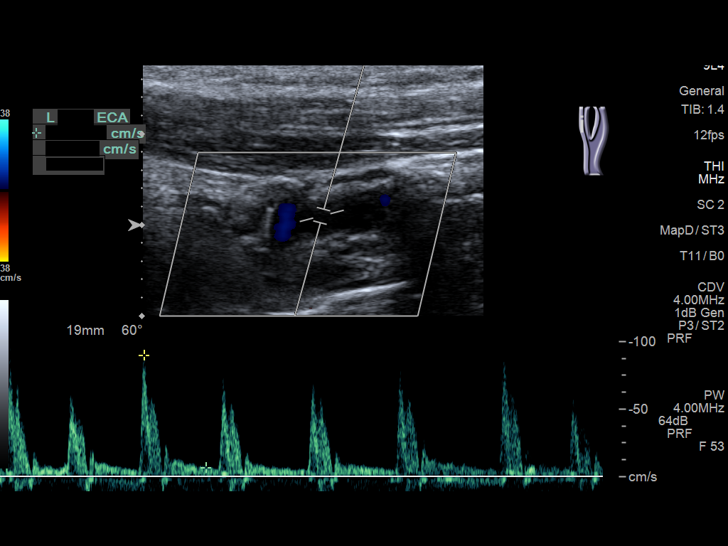
[im 62/68]
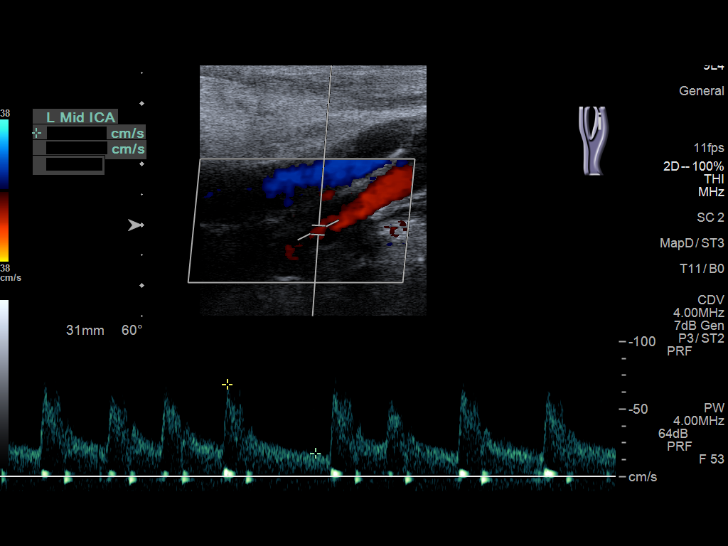
[im 68/68]
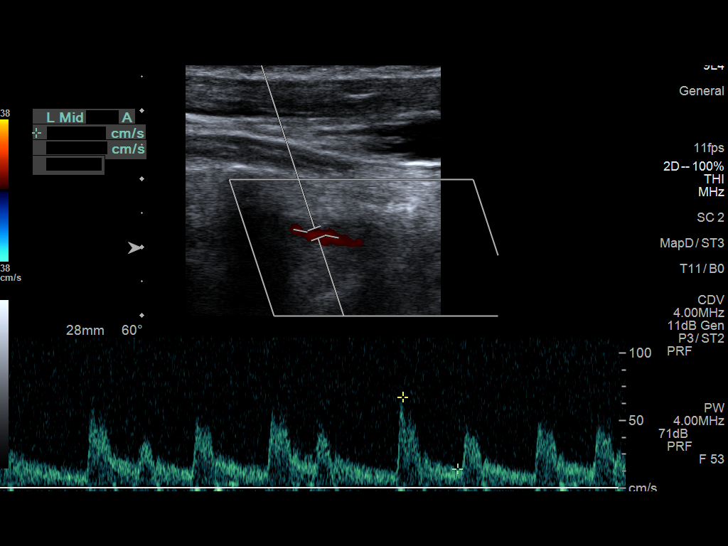

[13 of 24 positions shown; findings below may reference images not displayed]

The following velocity measurements were obtained:

PEAK SYSTOLIC/END DIASTOLIC

RIGHT

ICA:                     63/13cm/sec

CCA:                     82/11cm/sec

SYSTOLIC ICA/CCA RATIO:

ECA:                     97cm/sec

LEFT

ICA:                     77/29cm/sec

CCA:                     89/11cm/sec

SYSTOLIC ICA/CCA RATIO:

ECA:                     90cm/sec
FINDINGS: RIGHT CAROTID ARTERY: Noncalcified intimal thickening/plaque in the
common carotid artery and bulb. Mild tortuosity of the ICA. No
high-grade stenosis. Normal waveforms and color Doppler signal.

RIGHT VERTEBRAL ARTERY:  Normal flow direction and waveform.

LEFT CAROTID ARTERY: A 1.2 cm cystic thyroid lesion is noted.
Intimal thickening/plaque in the common carotid artery and bulb. No
significant stenosis. Normal waveforms and color Doppler signal..

LEFT VERTEBRAL ARTERY: Normal flow direction and waveform.
IMPRESSION: 1. Mild smooth bilateral carotid bifurcation plaque resulting in
less than 50% diameter stenosis.
2.  Antegrade bilateral vertebral arterial flow.

## 2020-06-17 ENCOUNTER — Other Ambulatory Visit: Payer: Self-pay | Admitting: Student

## 2020-06-17 DIAGNOSIS — M19011 Primary osteoarthritis, right shoulder: Secondary | ICD-10-CM

## 2020-06-17 DIAGNOSIS — Z9889 Other specified postprocedural states: Secondary | ICD-10-CM

## 2020-06-17 DIAGNOSIS — M7581 Other shoulder lesions, right shoulder: Secondary | ICD-10-CM

## 2020-09-11 ENCOUNTER — Other Ambulatory Visit: Payer: Self-pay | Admitting: Family Medicine

## 2020-09-11 DIAGNOSIS — Z1231 Encounter for screening mammogram for malignant neoplasm of breast: Secondary | ICD-10-CM

## 2020-10-01 ENCOUNTER — Ambulatory Visit
Admission: RE | Admit: 2020-10-01 | Discharge: 2020-10-01 | Disposition: A | Discharge status: Home / Self Care | Payer: Medicare Other | Source: Ambulatory Visit | Attending: Family Medicine | Admitting: Family Medicine

## 2020-10-01 ENCOUNTER — Other Ambulatory Visit: Payer: Self-pay

## 2020-10-01 DIAGNOSIS — Z1231 Encounter for screening mammogram for malignant neoplasm of breast: Secondary | ICD-10-CM

## 2021-12-04 DIAGNOSIS — C7951 Secondary malignant neoplasm of bone: Secondary | ICD-10-CM | POA: Insufficient documentation

## 2021-12-24 ENCOUNTER — Other Ambulatory Visit: Payer: Self-pay | Admitting: Family Medicine

## 2021-12-24 DIAGNOSIS — Z1231 Encounter for screening mammogram for malignant neoplasm of breast: Secondary | ICD-10-CM

## 2021-12-26 ENCOUNTER — Ambulatory Visit
Admission: RE | Admit: 2021-12-26 | Discharge: 2021-12-26 | Disposition: A | Discharge status: Home / Self Care | Payer: Medicare Other | Source: Ambulatory Visit | Attending: Family Medicine | Admitting: Family Medicine

## 2021-12-26 ENCOUNTER — Other Ambulatory Visit: Payer: Self-pay

## 2021-12-26 DIAGNOSIS — Z1231 Encounter for screening mammogram for malignant neoplasm of breast: Secondary | ICD-10-CM | POA: Diagnosis not present

## 2022-01-25 DIAGNOSIS — R627 Adult failure to thrive: Secondary | ICD-10-CM | POA: Insufficient documentation

## 2023-04-13 ENCOUNTER — Telehealth: Payer: Self-pay

## 2023-04-13 NOTE — Telephone Encounter (Signed)
Referral reviewed from Dr. Larwance Sachs. Olivia Burton followed with Dr. Orlie Dakin and was released from his clinic for DCIS in 2019. Last imaging noted was 01/2022 for her uterine cancer. Dr. Larwance Sachs has ordered CT AP and this was scheduled in [place] for 04/22/2023. We will arrange appointments for med and gynonc 04/28/23. Spoke with Olivia Burton and spouse they are in agreement with this. She was offered an appointment prior to imaging but they would also like to have everything completed before coming.

## 2023-04-21 ENCOUNTER — Ambulatory Visit: Payer: Medicare Other | Admitting: Oncology

## 2023-04-21 ENCOUNTER — Ambulatory Visit: Payer: Medicare Other

## 2023-04-21 ENCOUNTER — Other Ambulatory Visit: Payer: Medicare Other

## 2023-04-26 ENCOUNTER — Inpatient Hospital Stay
Admission: RE | Admit: 2023-04-26 | Discharge: 2023-04-26 | Disposition: A | Discharge status: Home / Self Care | Payer: Self-pay | Source: Ambulatory Visit | Attending: Oncology | Admitting: Oncology

## 2023-04-26 ENCOUNTER — Encounter: Payer: Self-pay | Admitting: Family Medicine

## 2023-04-26 ENCOUNTER — Telehealth: Payer: Self-pay

## 2023-04-26 ENCOUNTER — Other Ambulatory Visit: Payer: Self-pay

## 2023-04-26 DIAGNOSIS — C541 Malignant neoplasm of endometrium: Secondary | ICD-10-CM

## 2023-04-26 NOTE — Telephone Encounter (Signed)
Contacted [facility] and requested CT AP from 04/22/2023 as well as July and October 2023 CT AP. MRI brain from 01/2023 also requested.

## 2023-04-28 ENCOUNTER — Inpatient Hospital Stay (HOSPITAL_BASED_OUTPATIENT_CLINIC_OR_DEPARTMENT_OTHER): Payer: Medicare Other | Admitting: Oncology

## 2023-04-28 ENCOUNTER — Inpatient Hospital Stay: Payer: Medicare Other

## 2023-04-28 ENCOUNTER — Inpatient Hospital Stay: Payer: Medicare Other | Attending: Oncology | Admitting: Obstetrics and Gynecology

## 2023-04-28 VITALS — BP 123/69 | HR 87 | Temp 97.6°F | Resp 20 | Wt 130.9 lb

## 2023-04-28 DIAGNOSIS — C7982 Secondary malignant neoplasm of genital organs: Secondary | ICD-10-CM | POA: Diagnosis not present

## 2023-04-28 DIAGNOSIS — C549 Malignant neoplasm of corpus uteri, unspecified: Secondary | ICD-10-CM

## 2023-04-28 DIAGNOSIS — C786 Secondary malignant neoplasm of retroperitoneum and peritoneum: Secondary | ICD-10-CM | POA: Diagnosis not present

## 2023-04-28 DIAGNOSIS — Z90710 Acquired absence of both cervix and uterus: Secondary | ICD-10-CM | POA: Diagnosis not present

## 2023-04-28 DIAGNOSIS — Z86000 Personal history of in-situ neoplasm of breast: Secondary | ICD-10-CM | POA: Insufficient documentation

## 2023-04-28 DIAGNOSIS — Z923 Personal history of irradiation: Secondary | ICD-10-CM | POA: Diagnosis not present

## 2023-04-28 DIAGNOSIS — C541 Malignant neoplasm of endometrium: Secondary | ICD-10-CM | POA: Diagnosis present

## 2023-04-28 DIAGNOSIS — Z90721 Acquired absence of ovaries, unilateral: Secondary | ICD-10-CM | POA: Diagnosis not present

## 2023-04-28 DIAGNOSIS — C787 Secondary malignant neoplasm of liver and intrahepatic bile duct: Secondary | ICD-10-CM | POA: Diagnosis not present

## 2023-04-28 DIAGNOSIS — C7951 Secondary malignant neoplasm of bone: Secondary | ICD-10-CM | POA: Diagnosis not present

## 2023-04-28 DIAGNOSIS — Z803 Family history of malignant neoplasm of breast: Secondary | ICD-10-CM | POA: Insufficient documentation

## 2023-04-28 DIAGNOSIS — Z515 Encounter for palliative care: Secondary | ICD-10-CM | POA: Diagnosis not present

## 2023-04-28 NOTE — Progress Notes (Signed)
Gynecologic Oncology Consult Visit   Referring Provider: Dr Larwance Sachs  Chief Complaint: Endometrial Cancer, recurrent  Subjective:  Olivia Burton is a 82 y.o. female who is seen in consultation from Dr. Larwance Sachs for recurrent endometrial cancer.   She was previously followed by [facility], Dr Jettie Booze.  01/22/21- Persistent heavy PMB started November 2021. Underwent HSC D&C 01/28/21 that revealed endometrial adenocarcinoma.  03/04/21- TLH-LSO with SLN mapping and left pelvic SLNBx, right pelvic lymphadenectomy, lysis of adhesions, diagnostic cystoscopy.   03/04/2021 Pathology  FINAL DIAGNOSIS AND ATTENDING SIGNATURE  A. LYMPH NODES, LEFT PELVIC SENTINEL, EXCISION: MICROMETASTATIC CARCINOMA, CONSISTENT WITH ENDOMETRIAL PRIMARY, INVOLVING ONE OF TWO LYMPH NODES (1/2). (See comment #1.)  B. UTERUS AND CERVIX, LEFT FALLOPIAN TUBE AND OVARY WITH VAGINAL MARGINS, TOTAL HYSTERECTOMY WITH UNILATERAL SALPINGO-OOPHORECTOMY: ENDOMYOMETRIUM: INVASIVE SEROUS CARCINOMA. (See comment #2 and synoptic report.) ADENOMYOSIS. LEIOMYOMA.   CERVIX: INVOLVED BY INVASIVE SEROUS CARCINOMA.  LEFT FALLOPIAN TUBE: HYDROSALPINX.  LEFT OVARY: NO PATHOLOGIC ABNORMALITY.  VAGINAL MARGIN: INVOLVED BY INVASIVE SEROUS CARCINOMA.   C. LYMPH NODES, RIGHT PELVIC, EXCISION: MICROMETASTATIC CARCINOMA, CONSISTENT WITH ENDOMETRIAL PRIMARY, INVOLVING ONE OF SIX LYMPH NODES (1/6). (See comment #3.)   Lynch IHC intact.   05/02/2021 - Chemotherapy  Carboplatin + Paclitaxel (Taxol)  Cycle 1: 05/02/21 (Taxol reaction x 2; discontinued) Cycle 2: 05/23/21 single-agent Carbo Cycle 3: 06/20/21 single-agent Carbo Cycle 4: 07/18/21 single-agent Carbo Cycle 5: 08/15/21 single-agent Carbo   11/19/2021 Recurrence  CT CAP: New extensive osseous metastasis of the sacrum which likely involves the adjacent spinal canal increasing volume operative bed recurrence and new enlarging peritoneal tumor implants. Similar retroperitoneal and central  mesenteric adenopathy. Given the relative stability and the morphologically different appearance of these lymph nodes relative to the recurrent tumor elsewhere, it is unclear (and felt unlikely) that the adenopathy is related to the patient's primary malignancy. Additional diagnostic considerations include lymphoma.   11/28/21- started 2nd line pembrolizumab;  Cycle 1: 11/28/21- pembrolizumab 12/09/21-12/16/21- radiation to sacrum and vaginal cuff Cycle 2: 01/02/22- pembrolizumab 01/23/22- CT C/A/P - increased heterogeneous demineralization and irregularity of left sacrum likely post radiation. No fracture. Previous metastatic implants in left hemi pelvis and dominant left vaginal cuff mass are decreased in size. Adenopathy not significantly changed. Likely benign right lower paratracheal lymph node. Enlarged heart, especially right atrium.  Cycle 3: 01/30/22- pembrolizumab; lenvatinib held d/t FTT and performance status.  Cycle 4: 02/27/22 pembrolizumab  CT C/A/P 01/23/2022: 1. Increased heterogeneous demineralization and irregularity of the left sacrum at the site of prior metastatic lesion, likely postradiation change. No definite pathologic fracture within the limits of mineralization. If there is persistent concern for fracture or infection consider pelvic MRI.  2. The previously noted metastatic implants within the left hemi pelvis and the dominant left vaginal cuff mass lesion are decreased in size as compared to prior. No new evidence of metastatic lesion.  3. Again seen retroperitoneal and central mesenteric adenopathy, not significantly changed from prior.  1. Small right, trace left pleural effusions. Likely benign right lower paratracheal lymph node. Attention on routine surveillance  2. Enlarged heart, especially the right atrium.   Care after 3/23 was done in [place] by another oncologist.   She had progression of disease on imaging and received 8-10 cycles of Doxil chemotherapy.  More  recently from 9/23-3-24 she received about 6 cycles of Enhertu, presumably due to HER2 overexpression.  In 3/24 she was tolerating treatment poorly and was bedbound.  She has been on a treatment holiday since then.  She has some vaginal bleeding and stopped taking Eliquis a week ago.     CT scan 04/22/23 shows abdominal carcinomatosis.   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 No hx of breast feeding. Never used HRT. No abnormal paps.   Family History: Family History  Problem Relation Age of Onset   Breast cancer Mother 89   Coronary artery disease Mother    Hypertension Mother    Stroke Mother    Heart attack Father     Social History: Social History   Socioeconomic History   Marital status: Married    Spouse name: Not on file   Number of children: Not on file   Years of education: Not on file   Highest education level: Not on file  Occupational History   Not on file  Tobacco Use   Smoking status: Never   Smokeless tobacco: Never  Substance and Sexual Activity   Alcohol use: Never   Drug use: Never   Sexual activity: Not on file  Other Topics Concern   Not on file  Social History Narrative   Not on file   Social Determinants of Health   Financial  Resource Strain: Not on file  Food Insecurity: Not on file  Transportation Needs: Not on file  Physical Activity: Not on file  Stress: Not on file  Social Connections: Not on file  Intimate Partner Violence: Not on file    Allergies: Allergies  Allergen Reactions   Quinine Hives    Current Medications: Current Outpatient Medications  Medication Sig Dispense Refill   apixaban (ELIQUIS) 5 MG TABS tablet Take 5 mg by mouth 2 (two) times daily.      B Complex-Folic Acid (SM BALANCED B-100) TABS Take 1 tablet by mouth daily.      Biotin 1000 MCG tablet Take 1,000 mcg by mouth daily.      calcium carbonate (TUMS - DOSED IN MG ELEMENTAL CALCIUM) 500 MG chewable tablet Chew 1 tablet by mouth daily.      CO ENZYME Q-10 PO Take 1 capsule by mouth daily.      Cyanocobalamin (VITAMIN B 12 PO) Take 1 tablet by mouth daily.      latanoprost (XALATAN) 0.005 % ophthalmic solution Place 1 drop into both eyes at bedtime.  5   Multiple Vitamin (MULTI-VITAMINS) TABS Take 1 tablet by mouth daily.      Red Yeast Rice 600 MG TABS Take 2 tablets by mouth daily.      rosuvastatin (CRESTOR) 10 MG tablet Take 10 mg by mouth daily.      SIMBRINZA 1-0.2 % SUSP INSTILL 1 DROP INTO BOTH EYES TWICE A DAY  5  vitamin C (ASCORBIC ACID) 500 MG tablet Take 500 mg by mouth daily.      No current facility-administered medications for this visit.    Review of Systems General: weight gain, weakness Skin: no complaints Eyes: no complaints HEENT: impaired vision, hearing loss, hoarseness Breasts: no complaints Pulmonary: no complaints Cardiac: no complaints Gastrointestinal: no complaints Genitourinary/Sexual: hematuria, vaginal bleeding, urinary retention Ob/Gyn: no complaints Musculoskeletal: right shoulder pain Hematology: no complaints Neurologic/Psych: right leg weakness   Objective:  Physical Examination:  BP 123/69   Pulse 87   Temp 97.6 F (36.4 C)   Resp 20   Wt 130 lb 14.4 oz (59.4 kg)    SpO2 100%   BMI 19.33 kg/m     ECOG Performance Status: 3 - Symptomatic, >50% confined to bed  GENERAL: frail appearing. In wheelchair. Requires assistance for mobilization & transfers HEENT:  Sclera clear. Anicteric NODES:  Negative axillary, supraclavicular, inguinal lymph node survery LUNGS:  Clear to auscultation bilaterally.   HEART:  Regular rate and rhythm.  ABDOMEN:  Soft, nontender.  No hernias, incisions well healed. No masses or ascites EXTREMITIES:  No peripheral edema. Atraumatic. No cyanosis SKIN:  Clear with no obvious rashes or skin changes.  NEURO:  memory loss. Poor/limited historian  Pelvic: Exam chaperoned by CMA EGBUS: no lesions Vagina: long and narrow.  Small amount of friable tissue concerning for recurrent cancer. She had significant bleeding from exam and monsels placed successfully to stop this.  Uterus: normal size, nontender, mobile Adnexa: no palpable masses Rectovaginal: confirmatory    Assessment:  Olivia Burton is a 82 y.o. female diagnosed with recurrent stage IIIC uterine cancer.   01/22/21- Persistent heavy PMB started November 2021. Underwent HSC D&C 01/28/21 that revealed endometrial adenocarcinoma.  03/04/21- TLH-LSO with SLN mapping and left pelvic SLNBx, right pelvic lymphadenectomy, lysis of adhesions, diagnostic cystoscopy. Dr Jettie Booze, [facility].  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  Bilateral SLNs with micromets and cervix involved with positive vaginal margins.    Lynch IHC intact. MMRP  5-8/22 - Carboplatin + Paclitaxel x 5 cycles.  11/19/2021 Recurrence  CT CAP: New extensive osseous metastasis of the sacrum which likely involves the adjacent spinal canal increasing volume operative bed recurrence and new enlarging peritoneal tumor implants. Similar retroperitoneal and central mesenteric adenopathy. Given the relative stability and the morphologically different appearance of these lymph nodes relative to the recurrent tumor elsewhere, it  is unclear (and felt unlikely) that the adenopathy is related to the patient's primary malignancy.    11/28/21- started 2nd line pembrolizumab;  Cycle 1: 11/28/21- pembrolizumab 12/09/21-12/16/21- radiation to sacrum and vaginal cuff Cycle 2: 01/02/22- pembrolizumab 01/23/22- CT C/A/P - increased heterogeneous demineralization and irregularity of left sacrum likely post radiation. No fracture. Previous metastatic implants in left hemi pelvis and dominant left vaginal cuff mass are decreased in size. Adenopathy not significantly changed.   Cycle 3: 01/30/22- pembrolizumab; lenvatinib held d/t FTT and performance status.  Cycle 4: 02/27/22 pembrolizumab  CT scan 2/23 CT C/A/P 01/23/2022: The previously noted metastatic implants within the left hemi pelvis and the dominant left vaginal cuff mass lesion are decreased in size as compared to prior. No new evidence of metastatic lesion.   Care after 3/23 was done in [place] by another oncologist and records not available today.  History per patient.  She had progression of disease on imaging and received 8-10 cycles of Doxil chemotherapy.  More recently from 9/23-3-24 she received about 6 cycles of Enhertu, presumably due  to HER2 overexpression.  In 3/24 she was tolerating treatment poorly and was bedbound.  She has been on a treatment holiday since then.    She has some vaginal bleeding and stopped taking Eliquis a week ago.  On exam today there is small amount of tumor erosion into the left vaginal apex.  Bleeding after speculum exam, so Monsels solution placed to stop it.   CT 04/22/23 shows abdominal carcinomatosis.     Medical co-morbidities complicating care: breast cancer, atrial fib, dementia.  Plan:   Problem List Items Addressed This Visit       Genitourinary   Endometrial cancer (HCC) - Primary   We discussed with the patient and husband options for management including continued treatment holiday since her performance status is improved  since stopping Enhertu and likely resistance to systemic therapies after 4 prior regimens.  Other options would include other HER2 directed therapies or gemcitabine.  Also discussed the merits of continued treatment holiday.  Discussed with Dr Orlie Dakin. He will order a PET scan and then see her back for follow up discussion with family.  Also will arrange for palliative care referral.    We can see her back in a few months to assess progress.   The patient's diagnosis, an outline of the further diagnostic and laboratory studies which will be required, the recommendation for surgery, and alternatives were discussed with her and her accompanying family members.  All questions were answered to their satisfaction.  Consuello Masse, DNP, AGNP-C, AOCNP [facility] [phone] (clinic)  I personally interviewed and examined the patient. Agreed with the above/below plan of care. I have directly contributed to assessment and plan of care of this patient and educated and discussed with patient and family.  Leida Lauth, MD  CC:  Kandyce Rud, MD [phone] [address] [facility] and Internal Medicine [place],  [place] [address] [phone]

## 2023-04-28 NOTE — Progress Notes (Signed)
[facility]  [phone] Fax:(336) [phone]  ID: Olivia Burton OB: 02-03-1941  MR#: [id number]  AOZ#:[id number]  Patient Care Team: Kandyce Rud, MD as PCP - General (Family Medicine) Benita Gutter, RN as Oncology Nurse Navigator Orlie Dakin, Tollie Pizza, MD as Consulting Physician (Oncology)  CHIEF COMPLAINT: Progressive stage IV uterine cancer  INTERVAL HISTORY: Patient is an 82 year old female who was last evaluated in clinic greater than 5 years ago with a diagnosis of DCIS who has been under treatment for progressive stage IV uterine cancer for approximately 2 years.  She has increased vaginal bleeding, but otherwise feels well.  She has no neurologic complaints.  She denies any recent fevers.  She has a fair appetite, but denies weight loss.  She has no chest pain, shortness of breath, cough, or hemoptysis.  She denies any nausea, vomiting, constipation, or diarrhea.  She has no urinary complaints.  Patient offers no further specific complaints today.  REVIEW OF SYSTEMS:   Review of Systems  Constitutional:  Positive for malaise/fatigue. Negative for fever and weight loss.  Respiratory: Negative.  Negative for cough, hemoptysis and shortness of breath.   Cardiovascular: Negative.  Negative for chest pain and leg swelling.  Gastrointestinal: Negative.  Negative for abdominal pain.  Genitourinary: Negative.  Negative for dysuria and hematuria.  Musculoskeletal: Negative.  Negative for back pain.  Skin: Negative.  Negative for rash.  Neurological: Negative.  Negative for dizziness, focal weakness, weakness and headaches.  Psychiatric/Behavioral:  The patient is not nervous/anxious.     As per HPI. Otherwise, a complete review of systems is negative.  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   DCIS (ductal carcinoma in situ)    Osteoarthritis    Personal history of radiation therapy      PAST SURGICAL HISTORY: Past Surgical History:  Procedure Laterality Date   BREAST BIOPSY Right 2012   +   BREAST LUMPECTOMY Right 2012   BUNIONECTOMY     CERVICAL LAMINECTOMY     ROTATOR CUFF REPAIR      FAMILY HISTORY: Family History  Problem Relation Age of Onset   Breast cancer Mother 9   Coronary artery disease Mother    Hypertension Mother    Stroke Mother    Heart attack Father     ADVANCED DIRECTIVES (Y/N):  N  HEALTH MAINTENANCE: Social History   Tobacco Use   Smoking status: Never   Smokeless tobacco: Never  Substance Use Topics   Alcohol use: Never   Drug use: Never     Colonoscopy:  PAP:  Bone density:  Lipid panel:  Allergies  Allergen Reactions   Quinine Hives    Current Outpatient Medications  Medication Sig Dispense Refill   Acetaminophen (TYLENOL) 325 MG CAPS Take by mouth.     apixaban (ELIQUIS) 5 MG TABS tablet Take 5 mg by mouth 2 (two) times daily.  (Patient not taking: Reported on 04/28/2023)     B Complex-Folic Acid (SM BALANCED B-100) TABS Take 1 tablet by mouth daily.  (Patient not taking: Reported on 04/28/2023)     Biotin 1000 MCG tablet Take 1,000 mcg by mouth daily.  (Patient not taking: Reported on 04/28/2023)     calcium carbonate (TUMS - DOSED IN MG ELEMENTAL CALCIUM) 500 MG chewable tablet Chew 1 tablet by mouth daily.  (Patient not taking: Reported on 04/28/2023)     CO ENZYME Q-10 PO Take 1  capsule by mouth daily.  (Patient not taking: Reported on 04/28/2023)     Cyanocobalamin (VITAMIN B 12 PO) Take 1 tablet by mouth daily.  (Patient not taking: Reported on 04/28/2023)     dicyclomine (BENTYL) 20 MG tablet Take by mouth.     latanoprost (XALATAN) 0.005 % ophthalmic solution Place 1 drop into both eyes at bedtime. (Patient not taking: Reported on 04/28/2023)  5   levothyroxine (SYNTHROID) 25 MCG tablet Take 25 mcg by mouth every morning.     Multiple Vitamin (MULTI-VITAMINS) TABS Take 1 tablet by mouth daily.  (Patient not taking:  Reported on 04/28/2023)     polyethylene glycol (MIRALAX) 17 g packet Take by mouth.     Red Yeast Rice 600 MG TABS Take 2 tablets by mouth daily.  (Patient not taking: Reported on 04/28/2023)     rosuvastatin (CRESTOR) 10 MG tablet Take 10 mg by mouth daily.     senna (SENOKOT) 8.6 MG tablet Take 1 tablet by mouth at bedtime.     SIMBRINZA 1-0.2 % SUSP INSTILL 1 DROP INTO BOTH EYES TWICE A DAY (Patient not taking: Reported on 04/28/2023)  5   vitamin C (ASCORBIC ACID) 500 MG tablet Take 500 mg by mouth daily.  (Patient not taking: Reported on 04/28/2023)     No current facility-administered medications for this visit.    OBJECTIVE: There were no vitals filed for this visit.   There is no height or weight on file to calculate BMI.    ECOG FS:1 - Symptomatic but completely ambulatory  General: Well-developed, well-nourished, no acute distress. Eyes: Pink conjunctiva, anicteric sclera. HEENT: Normocephalic, moist mucous membranes. Lungs: No audible wheezing or coughing. Heart: Regular rate and rhythm. Abdomen: Soft, nontender, no obvious distention. Musculoskeletal: No edema, cyanosis, or clubbing. Neuro: Alert, answering all questions appropriately. Cranial nerves grossly intact. Skin: No rashes or petechiae noted. Psych: Normal affect. Lymphatics: No cervical, calvicular, axillary or inguinal LAD.   LAB RESULTS:  Lab Results  Component Value Date   NA 143 05/12/2018   K 4.0 05/12/2018   CL 111 05/12/2018   CO2 26 05/12/2018   GLUCOSE 98 05/12/2018   BUN 14 05/12/2018   CREATININE 0.80 05/12/2018   CALCIUM 8.7 (L) 05/12/2018   PROT 6.7 05/12/2018   ALBUMIN 3.8 05/12/2018   AST 27 05/12/2018   ALT 19 05/12/2018   ALKPHOS 66 05/12/2018   BILITOT 1.1 05/12/2018   GFRNONAA >60 05/12/2018   GFRAA >60 05/12/2018    Lab Results  Component Value Date   WBC 3.9 05/12/2018   NEUTROABS 1.6 05/12/2018   HGB 14.1 05/12/2018   HCT 42.6 05/12/2018   MCV 90.3 05/12/2018   PLT 177  05/12/2018     STUDIES: No results found.  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  She is also undergone multiple rounds of treatment which included carboplatin and Taxol, Doxil, Keytruda/lenvatinib, and more recently Enhertu.  Patient has also had pelvic XRT.  Patient reports she has been off treatment since approximately March 2024.  Will get a PET scan in the next 1 to 2 weeks to determine extent of active disease.  Patient will then follow-up 2 to 3 days later for goals of care discussion, treatment planning if desired, and palliative care consult.  Appreciate gynecology oncology input. Vaginal bleeding: Resolved.  Patient has discontinued Eliquis. History  of DCIS: Patient completed tamoxifen in approximately 2019.  I spent a total of 60 minutes reviewing chart data, face-to-face evaluation with the patient, counseling and coordination of care as detailed above.  Patient expressed understanding and was in agreement with this plan. She also understands that She can call clinic at any time with any questions, concerns, or complaints.    Cancer Staging  Endometrial cancer [facility]) Staging form: Corpus Uteri - Carcinoma and Carcinosarcoma, AJCC 8th Edition - Clinical stage from 04/28/2023: FIGO Stage IVB (cTX, cNX, pM1) - Signed by Jeralyn Ruths, MD on 04/28/2023 Stage prefix: Initial diagnosis   Jeralyn Ruths, MD   04/28/2023 3:00 PM

## 2023-05-05 ENCOUNTER — Inpatient Hospital Stay (HOSPITAL_BASED_OUTPATIENT_CLINIC_OR_DEPARTMENT_OTHER): Payer: Medicare Other | Admitting: Hospice and Palliative Medicine

## 2023-05-05 DIAGNOSIS — C541 Malignant neoplasm of endometrium: Secondary | ICD-10-CM

## 2023-05-05 NOTE — Progress Notes (Signed)
Multidisciplinary Oncology Council Documentation  Olivia Burton was presented by our [facility] on 05/05/2023, which included representatives from:  Palliative Care Dietitian  Physical/Occupational Therapist Nurse Navigator Genetics Speech Therapist Social work Survivorship RN Financial Navigator Research RN   Gretell currently presents with history of uterine cancer  We reviewed previous medical and familial history, history of present illness, and recent lab results along with all available histopathologic and imaging studies. The MOC considered available treatment options and made the following recommendations/referrals:  Palliative care  The MOC is a meeting of clinicians from various specialty areas who evaluate and discuss patients for whom a multidisciplinary approach is being considered. Final determinations in the plan of care are those of the provider(s).   Today's extended care, comprehensive team conference, Alyviah was not present for the discussion and was not examined.

## 2023-05-06 ENCOUNTER — Encounter (HOSPITAL_COMMUNITY)
Admission: RE | Admit: 2023-05-06 | Discharge: 2023-05-06 | Disposition: A | Discharge status: Home / Self Care | Payer: Medicare Other | Source: Ambulatory Visit | Attending: Oncology | Admitting: Oncology

## 2023-05-06 DIAGNOSIS — C541 Malignant neoplasm of endometrium: Secondary | ICD-10-CM

## 2023-05-06 MED ORDER — FLUDEOXYGLUCOSE F - 18 (FDG) INJECTION
6.3900 | Freq: Once | INTRAVENOUS | Status: AC | PRN
  Administered 2023-05-06: 6.39 via INTRAVENOUS

## 2023-05-11 ENCOUNTER — Inpatient Hospital Stay: Payer: Medicare Other | Admitting: Oncology

## 2023-05-11 ENCOUNTER — Inpatient Hospital Stay (HOSPITAL_BASED_OUTPATIENT_CLINIC_OR_DEPARTMENT_OTHER): Payer: Medicare Other | Admitting: Hospice and Palliative Medicine

## 2023-05-11 ENCOUNTER — Encounter: Payer: Self-pay | Admitting: Oncology

## 2023-05-11 VITALS — BP 116/77 | HR 67 | Temp 98.4°F | Resp 16 | Ht 69.0 in | Wt 126.3 lb

## 2023-05-11 DIAGNOSIS — C541 Malignant neoplasm of endometrium: Secondary | ICD-10-CM

## 2023-05-11 NOTE — Progress Notes (Signed)
[facility]  Telephone:(336) [phone] [phone]) [phone]  ID: Olivia Burton OB: 23-Apr-1941  MR#: [id number]  QIO#:[id number]  Patient Care Team: Kandyce Rud, MD as PCP - General (Family Medicine) Benita Gutter, RN as Oncology Nurse Navigator Orlie Dakin, Tollie Pizza, MD as Consulting Physician (Oncology)  CHIEF COMPLAINT: Progressive stage IV uterine cancer  INTERVAL HISTORY: Patient returns to clinic today for further evaluation, discussion of her PET scan results, and treatment planning.  She continues to have chronic weakness and fatigue.  She does not complain of pain or vaginal bleeding today.  She has no neurologic complaints.  She denies any recent fevers.  She has a good appetite and denies weight loss.  She has no chest pain, shortness of breath, cough, or hemoptysis.  She denies any nausea, vomiting, constipation, or diarrhea.  She has no urinary complaints.  Patient offers no further specific complaints today.  REVIEW OF SYSTEMS:   Review of Systems  Constitutional:  Positive for malaise/fatigue. Negative for fever and weight loss.  Respiratory: Negative.  Negative for cough, hemoptysis and shortness of breath.   Cardiovascular: Negative.  Negative for chest pain and leg swelling.  Gastrointestinal: Negative.  Negative for abdominal pain.  Genitourinary: Negative.  Negative for dysuria and hematuria.  Musculoskeletal: Negative.  Negative for back pain.  Skin: Negative.  Negative for rash.  Neurological:  Positive for weakness. Negative for dizziness, focal weakness and headaches.  Psychiatric/Behavioral:  The patient is not nervous/anxious.     As per HPI. Otherwise, a complete review of systems is negative.  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   DCIS (ductal carcinoma in situ)    Osteoarthritis    Personal history of radiation therapy     PAST SURGICAL HISTORY: Past Surgical  History:  Procedure Laterality Date   BREAST BIOPSY Right 2012   +   BREAST LUMPECTOMY Right 2012   BUNIONECTOMY     CERVICAL LAMINECTOMY     ROTATOR CUFF REPAIR      FAMILY HISTORY: Family History  Problem Relation Age of Onset   Breast cancer Mother 33   Coronary artery disease Mother    Hypertension Mother    Stroke Mother    Heart attack Father     ADVANCED DIRECTIVES (Y/N):  N  HEALTH MAINTENANCE: Social History   Tobacco Use   Smoking status: Never   Smokeless tobacco: Never  Substance Use Topics   Alcohol use: Never   Drug use: Never     Colonoscopy:  PAP:  Bone density:  Lipid panel:  Allergies  Allergen Reactions   Quinine Hives    Current Outpatient Medications  Medication Sig Dispense Refill   Acetaminophen (TYLENOL) 325 MG CAPS Take by mouth.     dicyclomine (BENTYL) 20 MG tablet Take by mouth.     diphenhydrAMINE (BENADRYL) 25 mg capsule Take 25 mg by mouth every 6 (six) hours as needed.     levothyroxine (SYNTHROID) 25 MCG tablet Take 25 mcg by mouth every morning.     ondansetron (ZOFRAN) 8 MG tablet Take 8 mg by mouth every 8 (eight) hours as needed.     polyethylene glycol (MIRALAX) 17 g packet Take by mouth.     senna (SENOKOT) 8.6 MG tablet Take 1 tablet by mouth at bedtime.     trimethoprim-polymyxin b (POLYTRIM) ophthalmic solution Place 1 drop into both eyes every 4 (four) hours.  apixaban (ELIQUIS) 5 MG TABS tablet Take 5 mg by mouth 2 (two) times daily.  (Patient not taking: Reported on 04/28/2023)     B Complex-Folic Acid (SM BALANCED B-100) TABS Take 1 tablet by mouth daily.  (Patient not taking: Reported on 04/28/2023)     Biotin 1000 MCG tablet Take 1,000 mcg by mouth daily.  (Patient not taking: Reported on 04/28/2023)     calcium carbonate (TUMS - DOSED IN MG ELEMENTAL CALCIUM) 500 MG chewable tablet Chew 1 tablet by mouth daily.  (Patient not taking: Reported on 04/28/2023)     CO ENZYME Q-10 PO Take 1 capsule by mouth daily.   (Patient not taking: Reported on 04/28/2023)     Cyanocobalamin (VITAMIN B 12 PO) Take 1 tablet by mouth daily.  (Patient not taking: Reported on 04/28/2023)     latanoprost (XALATAN) 0.005 % ophthalmic solution Place 1 drop into both eyes at bedtime. (Patient not taking: Reported on 04/28/2023)  5   lidocaine-prilocaine (EMLA) cream Apply to affected area once 30 g 3   Multiple Vitamin (MULTI-VITAMINS) TABS Take 1 tablet by mouth daily.  (Patient not taking: Reported on 04/28/2023)     ondansetron (ZOFRAN) 8 MG tablet Take 1 tablet (8 mg total) by mouth every 8 (eight) hours as needed for nausea or vomiting. 60 tablet 2   Red Yeast Rice 600 MG TABS Take 2 tablets by mouth daily.  (Patient not taking: Reported on 04/28/2023)     rosuvastatin (CRESTOR) 10 MG tablet Take 10 mg by mouth daily.     SIMBRINZA 1-0.2 % SUSP INSTILL 1 DROP INTO BOTH EYES TWICE A DAY (Patient not taking: Reported on 04/28/2023)  5   vitamin C (ASCORBIC ACID) 500 MG tablet Take 500 mg by mouth daily.  (Patient not taking: Reported on 04/28/2023)     No current facility-administered medications for this visit.    OBJECTIVE: Vitals:   05/11/23 1318  BP: 116/77  Pulse: 67  Resp: 16  Temp: 98.4 F (36.9 C)  SpO2: 100%     Body mass index is 18.65 kg/m.    ECOG FS:1 - Symptomatic but completely ambulatory  General: Well-developed, well-nourished, no acute distress. Eyes: Pink conjunctiva, anicteric sclera. HEENT: Normocephalic, moist mucous membranes. Lungs: No audible wheezing or coughing. Heart: Regular rate and rhythm. Abdomen: Soft, nontender, no obvious distention. Musculoskeletal: No edema, cyanosis, or clubbing. Neuro: Alert, answering all questions appropriately. Cranial nerves grossly intact. Skin: No rashes or petechiae noted. Psych: Normal affect.   LAB RESULTS:  Lab Results  Component Value Date   NA 143 05/12/2018   K 4.0 05/12/2018   CL 111 05/12/2018   CO2 26 05/12/2018   GLUCOSE 98 05/12/2018   BUN  14 05/12/2018   CREATININE 0.80 05/12/2018   CALCIUM 8.7 (L) 05/12/2018   PROT 6.7 05/12/2018   ALBUMIN 3.8 05/12/2018   AST 27 05/12/2018   ALT 19 05/12/2018   ALKPHOS 66 05/12/2018   BILITOT 1.1 05/12/2018   GFRNONAA >60 05/12/2018   GFRAA >60 05/12/2018    Lab Results  Component Value Date   WBC 3.9 05/12/2018   NEUTROABS 1.6 05/12/2018   HGB 14.1 05/12/2018   HCT 42.6 05/12/2018   MCV 90.3 05/12/2018   PLT 177 05/12/2018     STUDIES: NM PET Image Initial (PI) Skull Base To Thigh  Result Date: 05/11/2023 CLINICAL DATA:  Subsequent treatment strategy for endometrial/cervical cancer. Status post hysterectomy in 2002. History of right breast cancer, status post  lumpectomy in 2009. EXAM: NUCLEAR MEDICINE PET SKULL BASE TO THIGH TECHNIQUE: 6.4 mCi F-18 FDG was injected intravenously. Full-ring PET imaging was performed from the skull base to thigh after the radiotracer. CT data was obtained and used for attenuation correction and anatomic localization. Fasting blood glucose: 101 mg/dl COMPARISON:  Outside hospital [facility]) CT abdomen/pelvis dated 03/23/2023). Outside hospital [facility]) CT chest dated 09/29/2022. FINDINGS: Mediastinal blood pool activity: SUV max 2.0 Liver activity: SUV max NA NECK: No hypermetabolic cervical lymphadenopathy. Incidental CT findings: None. CHEST: 2.1 x 1.9 cm medial right lower lobe nodule (series 7/image 60), previously 1.6 x 1.5 cm, max SUV 14.6, compatible with pulmonary metastasis. No hypermetabolic thoracic lymphadenopathy. Right chest port terminates in the upper right atrium. Incidental CT findings: Cardiomegaly. Atherosclerotic calcifications of the aortic arch. Moderate three-vessel coronary atherosclerosis. ABDOMEN/PELVIS: Abnormal soft tissue along the left vaginal cuff, measuring approximately 1.7 x 2.5 cm (series 3/image 214), similar to the prior, max SUV 11.3, reflecting local recurrence.  Dominant 2.0 cm peritoneal implant along the left mid abdomen (series 3/image 166), previously 1.9 cm, max SUV 10.8. Additional implants along the left lateral/upper abdominal wall with max SUV 11.9 and along the anterior abdominal mesentery with max SUV 11.0. Suspected peritoneal implant versus hepatic metastasis along the falciform ligament, poorly visualized on unenhanced CT but measuring 14 mm on the prior enhanced study, max SUV 7.9. 6 mm short axis pericaval node (series 3/image 141), max SUV 5.5. 9 mm short axis right external iliac node versus pelvic peritoneal metastasis (series 3/image 206), max SUV 8.6. No abnormal hypermetabolism in the spleen, pancreas, or adrenal glands. Incidental CT findings: Benign hepatic cysts. Gallbladder distension. Atherosclerotic calcifications of the abdominal aorta and branch vessels. Trace ascites along the inferior right liver margin. SKELETON: Multifocal osseous metastases, including: --Right posteromedial 5th rib, max SUV 11.9 --Right lateral T6 vertebral body, max SUV 12.4 --Left sacral ala, max SUV 4.6 --Left iliac bone max SUV 4.1 --Right proximal femur, max SUV 4.0 Incidental CT findings: Degenerative changes of the visualized thoracolumbar spine. IMPRESSION: Local recurrence along the left vaginal cuff. Multifocal peritoneal implants with small retroperitoneal/pelvic nodes, as above. 2.1 cm right lower lobe metastasis, increased. Multifocal osseous metastases, as above. Electronically Signed   By: Charline Bills M.D.   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  She is also undergone multiple rounds of treatment which included carboplatin and Taxol, Doxil, Keytruda/lenvatinib, and more recently Enhertu.  Patient has also had pelvic XRT.  Patient reports she has been off treatment since approximately  March 2024.  PET scan results from May 11, 2023 reviewed independently and reported as above with localized recurrence as well as multifocal peritoneal implants, retroperitoneal lymphadenopathy, and multifocal osseous metastasis.  We discussed hospice and end-of-life today, but patient declined and wishes to pursue treatment.  Plan is to give single agent dose reduced gemcitabine on days 1 and 8 with day 15 off.  This will be a 21-day cycle.  Patient and her family expressed understanding that there are likely no further treatment options after this.  Return to clinic on May 26, 2023 to initiate cycle 1, day 1.  Appreciate palliative care input.   Bony metastasis: Patient will receive Zometa on odd-numbered cycles. Vaginal bleeding: Resolved.  Patient has discontinued Eliquis. History of DCIS:  Patient completed tamoxifen in approximately 2019.  I spent a total of 30 minutes reviewing chart data, face-to-face evaluation with the patient, counseling and coordination of care as detailed above.   Patient expressed understanding and was in agreement with this plan. She also understands that She can call clinic at any time with any questions, concerns, or complaints.    Cancer Staging  Endometrial cancer [facility]) Staging form: Corpus Uteri - Carcinoma and Carcinosarcoma, AJCC 8th Edition - Clinical stage from 04/28/2023: FIGO Stage IVB (cTX, cNX, pM1) - Signed by Jeralyn Ruths, MD on 04/28/2023 Stage prefix: Initial diagnosis   Jeralyn Ruths, MD   05/12/2023 7:14 AM

## 2023-05-11 NOTE — Progress Notes (Unsigned)
Husband concerned about energy level. Wanted to see if anything can be given to help.

## 2023-05-11 NOTE — Progress Notes (Signed)
Palliative Medicine [facility] at [facility] Telephone:(336) [phone] Fax:(336) [phone]   Name: Olivia Burton Date: 05/11/2023 MRN: [id number]  DOB: May 20, 1941  Patient Care Team: Kandyce Rud, MD as PCP - General (Family Medicine) Benita Gutter, RN as Oncology Nurse Navigator Orlie Dakin, Tollie Pizza, MD as Consulting Physician (Oncology)    REASON FOR CONSULTATION: NALIA Burton is a 82 y.o. 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  Patient is status post multiple previous lines of treatment.  Treatment was ultimately held due to declining performance status.  She was referred to palliative care to address goals.  SOCIAL HISTORY:     reports that she has never smoked. She has never used smokeless tobacco. She reports that she does not drink alcohol and does not use drugs.  Patient is married lives at home with her husband who is her primary caregiver.  She also has a son who is involved.  ADVANCE DIRECTIVES:    CODE STATUS:   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   DCIS (ductal carcinoma in situ)    Osteoarthritis    Personal history of radiation therapy     PAST SURGICAL HISTORY:  Past Surgical History:  Procedure Laterality Date   BREAST BIOPSY Right 2012   +   BREAST LUMPECTOMY Right 2012   BUNIONECTOMY     CERVICAL LAMINECTOMY     ROTATOR CUFF REPAIR      HEMATOLOGY/ONCOLOGY HISTORY:  Oncology History  Endometrial cancer (HCC)  04/28/2023 Initial Diagnosis   Endometrial cancer (HCC)   04/28/2023 Cancer Staging   Staging form: Corpus Uteri - Carcinoma and Carcinosarcoma, AJCC 8th Edition - Clinical stage from 04/28/2023: FIGO Stage IVB (cTX, cNX, pM1) - Signed by Jeralyn Ruths, MD on 04/28/2023 Stage prefix: Initial diagnosis     ALLERGIES:  is allergic to  quinine.  MEDICATIONS:  Current Outpatient Medications  Medication Sig Dispense Refill   Acetaminophen (TYLENOL) 325 MG CAPS Take by mouth.     apixaban (ELIQUIS) 5 MG TABS tablet Take 5 mg by mouth 2 (two) times daily.  (Patient not taking: Reported on 04/28/2023)     B Complex-Folic Acid (SM BALANCED B-100) TABS Take 1 tablet by mouth daily.  (Patient not taking: Reported on 04/28/2023)     Biotin 1000 MCG tablet Take 1,000 mcg by mouth daily.  (Patient not taking: Reported on 04/28/2023)     calcium carbonate (TUMS - DOSED IN MG ELEMENTAL CALCIUM) 500 MG chewable tablet Chew 1 tablet by mouth daily.  (Patient not taking: Reported on 04/28/2023)     CO ENZYME Q-10 PO Take 1 capsule by mouth daily.  (Patient not taking: Reported on 04/28/2023)     Cyanocobalamin (VITAMIN B 12 PO) Take 1 tablet by mouth daily.  (Patient not taking: Reported on 04/28/2023)     dicyclomine (BENTYL) 20 MG tablet Take by mouth.     latanoprost (XALATAN) 0.005 % ophthalmic solution Place 1 drop into both eyes at bedtime. (Patient not taking: Reported on 04/28/2023)  5   levothyroxine (SYNTHROID) 25 MCG tablet Take 25 mcg by mouth every morning.     Multiple Vitamin (MULTI-VITAMINS) TABS Take 1 tablet by mouth daily.  (Patient not taking: Reported on 04/28/2023)     polyethylene glycol (MIRALAX) 17 g packet Take by mouth.  Red Yeast Rice 600 MG TABS Take 2 tablets by mouth daily.  (Patient not taking: Reported on 04/28/2023)     rosuvastatin (CRESTOR) 10 MG tablet Take 10 mg by mouth daily.     senna (SENOKOT) 8.6 MG tablet Take 1 tablet by mouth at bedtime.     SIMBRINZA 1-0.2 % SUSP INSTILL 1 DROP INTO BOTH EYES TWICE A DAY (Patient not taking: Reported on 04/28/2023)  5   vitamin C (ASCORBIC ACID) 500 MG tablet Take 500 mg by mouth daily.  (Patient not taking: Reported on 04/28/2023)     No current facility-administered medications for this visit.    VITAL SIGNS: There were no vitals taken for this visit. There were no vitals  filed for this visit.  Estimated body mass index is 19.33 kg/m as calculated from the following:   Height as of 05/12/18: 5\' 9"  (1.753 m).   Weight as of 04/28/23: 130 lb 14.4 oz (59.4 kg).  LABS: CBC:    Component Value Date/Time   WBC 3.9 05/12/2018 1857   HGB 14.1 05/12/2018 1857   HGB 12.9 01/03/2013 1105   HCT 42.6 05/12/2018 1857   HCT 38.4 01/03/2013 1105   PLT 177 05/12/2018 1857   PLT 179 01/03/2013 1105   MCV 90.3 05/12/2018 1857   MCV 88 01/03/2013 1105   NEUTROABS 1.6 05/12/2018 1857   NEUTROABS 1.2 (L) 01/03/2013 1105   LYMPHSABS 1.8 05/12/2018 1857   LYMPHSABS 1.2 01/03/2013 1105   MONOABS 0.4 05/12/2018 1857   MONOABS 0.4 01/03/2013 1105   EOSABS 0.0 05/12/2018 1857   EOSABS 0.1 01/03/2013 1105   BASOSABS 0.0 05/12/2018 1857   BASOSABS 0.0 01/03/2013 1105   Comprehensive Metabolic Panel:    Component Value Date/Time   NA 143 05/12/2018 1857   K 4.0 05/12/2018 1857   CL 111 05/12/2018 1857   CO2 26 05/12/2018 1857   BUN 14 05/12/2018 1857   CREATININE 0.80 05/12/2018 1857   GLUCOSE 98 05/12/2018 1857   CALCIUM 8.7 (L) 05/12/2018 1857   AST 27 05/12/2018 1857   ALT 19 05/12/2018 1857   ALKPHOS 66 05/12/2018 1857   BILITOT 1.1 05/12/2018 1857   PROT 6.7 05/12/2018 1857   ALBUMIN 3.8 05/12/2018 1857    RADIOGRAPHIC STUDIES: NM PET Image Initial (PI) Skull Base To Thigh  Result Date: 05/11/2023 CLINICAL DATA:  Subsequent treatment strategy for endometrial/cervical cancer. Status post hysterectomy in 2002. History of right breast cancer, status post lumpectomy in 2009. EXAM: NUCLEAR MEDICINE PET SKULL BASE TO THIGH TECHNIQUE: 6.4 mCi F-18 FDG was injected intravenously. Full-ring PET imaging was performed from the skull base to thigh after the radiotracer. CT data was obtained and used for attenuation correction and anatomic localization. Fasting blood glucose: 101 mg/dl COMPARISON:  Outside hospital [facility]) CT abdomen/pelvis dated  03/23/2023). Outside hospital [facility]) CT chest dated 09/29/2022. FINDINGS: Mediastinal blood pool activity: SUV max 2.0 Liver activity: SUV max NA NECK: No hypermetabolic cervical lymphadenopathy. Incidental CT findings: None. CHEST: 2.1 x 1.9 cm medial right lower lobe nodule (series 7/image 60), previously 1.6 x 1.5 cm, max SUV 14.6, compatible with pulmonary metastasis. No hypermetabolic thoracic lymphadenopathy. Right chest port terminates in the upper right atrium. Incidental CT findings: Cardiomegaly. Atherosclerotic calcifications of the aortic arch. Moderate three-vessel coronary atherosclerosis. ABDOMEN/PELVIS: Abnormal soft tissue along the left vaginal cuff, measuring approximately 1.7 x 2.5 cm (series 3/image 214), similar to the prior, max SUV 11.3, reflecting local recurrence. Dominant  2.0 cm peritoneal implant along the left mid abdomen (series 3/image 166), previously 1.9 cm, max SUV 10.8. Additional implants along the left lateral/upper abdominal wall with max SUV 11.9 and along the anterior abdominal mesentery with max SUV 11.0. Suspected peritoneal implant versus hepatic metastasis along the falciform ligament, poorly visualized on unenhanced CT but measuring 14 mm on the prior enhanced study, max SUV 7.9. 6 mm short axis pericaval node (series 3/image 141), max SUV 5.5. 9 mm short axis right external iliac node versus pelvic peritoneal metastasis (series 3/image 206), max SUV 8.6. No abnormal hypermetabolism in the spleen, pancreas, or adrenal glands. Incidental CT findings: Benign hepatic cysts. Gallbladder distension. Atherosclerotic calcifications of the abdominal aorta and branch vessels. Trace ascites along the inferior right liver margin. SKELETON: Multifocal osseous metastases, including: --Right posteromedial 5th rib, max SUV 11.9 --Right lateral T6 vertebral body, max SUV 12.4 --Left sacral ala, max SUV 4.6 --Left iliac bone max SUV 4.1 --Right proximal femur,  max SUV 4.0 Incidental CT findings: Degenerative changes of the visualized thoracolumbar spine. IMPRESSION: Local recurrence along the left vaginal cuff. Multifocal peritoneal implants with small retroperitoneal/pelvic nodes, as above. 2.1 cm right lower lobe metastasis, increased. Multifocal osseous metastases, as above. Electronically Signed   By: Charline Bills M.D.   On: 05/11/2023 00:05    PERFORMANCE STATUS (ECOG) : 2 - Symptomatic, <50% confined to bed  Review of Systems Unless otherwise noted, a complete review of systems is negative.  Physical Exam General: NAD Pulmonary: Unlabored Extremities: no edema, no joint deformities Skin: no rashes Neurological: Weakness but otherwise nonfocal  IMPRESSION: PET scan 05/06/2023 revealed local recurrence along the left vaginal cuff, multifocal peritoneal implants, increased right lower lobe metastasis, and multifocal osseous metastases.  I met with patient, husband, and son.  They also spoke with Dr. Orlie Dakin today who offered best supportive care/hospice versus next line chemotherapy with single agent gemcitabine.  Patient/family verbalized understanding that gemcitabine would likely be the last option available to them at this point in light of significant progressive disease.  Patient would like to proceed with chemotherapy.  Symptomatically, she has weakness but denies other distressing symptoms at present.  Appetite is reportedly fair.  Patient is drinking nutritional supplements.  At baseline, patient lives at home with her husband who is her primary caregiver.  Patient ambulates with use of a walker.  She tripped and fell about a week ago but has not fallen since.  Patient is able to maintain some independence with her ADLs.   PLAN: -Continue current scope of treatment -Will benefit from ACP/MOST Form -RTC 2 to 3 weeks  Case and plan discussed with Dr. Orlie Dakin   Patient expressed understanding and was in agreement with this  plan. She also understands that She can call the clinic at any time with any questions, concerns, or complaints.     Time Total: 15 minutes  Visit consisted of counseling and education dealing with the complex and emotionally intense issues of symptom management and palliative care in the setting of serious and potentially life-threatening illness.Greater than 50%  of this time was spent counseling and coordinating care related to the above assessment and plan.  Signed by: Laurette Schimke, PhD, NP-C

## 2023-05-12 ENCOUNTER — Encounter: Payer: Self-pay | Admitting: Oncology

## 2023-05-12 MED ORDER — ONDANSETRON HCL 8 MG PO TABS
8.0000 mg | ORAL_TABLET | Freq: Three times a day (TID) | ORAL | 2 refills | Status: DC | PRN
Start: 2023-05-12 — End: 2023-08-06

## 2023-05-12 MED ORDER — LIDOCAINE-PRILOCAINE 2.5-2.5 % EX CREA
TOPICAL_CREAM | CUTANEOUS | 3 refills | Status: DC
Start: 2023-05-12 — End: 2023-08-06

## 2023-05-12 NOTE — Progress Notes (Signed)
START OFF PATHWAY REGIMEN - Uterine   OFF00015:Gemcitabine 1,000 mg/m2 IV D1,8,15 q28 Days:   A cycle is every 28 days:     Gemcitabine   **Always confirm dose/schedule in your pharmacy ordering system**  Patient Characteristics: Serous Carcinoma, Recurrent/Progressive Disease, Third Line and Beyond, HER2 Positive Histology: Serous Carcinoma Therapeutic Status: Recurrent or Progressive Disease Line of Therapy: Third Line and Beyond HER2 Status: Positive Intent of Therapy: Non-Curative / Palliative Intent, Discussed with Patient

## 2023-05-21 ENCOUNTER — Telehealth: Payer: Medicare Other | Admitting: Hospice and Palliative Medicine

## 2023-05-21 ENCOUNTER — Inpatient Hospital Stay (HOSPITAL_BASED_OUTPATIENT_CLINIC_OR_DEPARTMENT_OTHER): Payer: Medicare Other | Admitting: Hospice and Palliative Medicine

## 2023-05-21 ENCOUNTER — Telehealth: Payer: Self-pay | Admitting: *Deleted

## 2023-05-21 DIAGNOSIS — C541 Malignant neoplasm of endometrium: Secondary | ICD-10-CM

## 2023-05-21 DIAGNOSIS — G893 Neoplasm related pain (acute) (chronic): Secondary | ICD-10-CM

## 2023-05-21 MED ORDER — TRAMADOL HCL 50 MG PO TABS: 50.0000 mg | ORAL_TABLET | Freq: Four times a day (QID) | ORAL | 0 refills | Status: DC | PRN

## 2023-05-21 NOTE — Progress Notes (Signed)
I called and spoke with patient's husband.  He reports that patient has had generalized and intermittent abdominal pain.  No abdominal distention.  Patient is having regular bowel movements.  No nausea or vomiting.  No suspicion for obstruction given clinical symptoms.  Husband says that patient was previously on Norco and MS Contin but these caused her to be drowsy.  He asks if anything "weaker" could be prescribed.  Will start tramadol 50 mg every 6 hours as needed.  Patient has scheduled follow-up next week in clinic.

## 2023-05-21 NOTE — Telephone Encounter (Signed)
Msg sent to scheduling to arrange a telephone encounter

## 2023-05-21 NOTE — Telephone Encounter (Signed)
Patient husband called reporting that patient is having pain and that Tylenol is not controlling it. He mentioned Oxycodone and MS ER as possibilities for between tylenol doses. Please advise

## 2023-05-26 ENCOUNTER — Encounter: Payer: Self-pay | Admitting: Oncology

## 2023-05-26 ENCOUNTER — Inpatient Hospital Stay: Payer: Medicare Other

## 2023-05-26 ENCOUNTER — Inpatient Hospital Stay (HOSPITAL_BASED_OUTPATIENT_CLINIC_OR_DEPARTMENT_OTHER): Payer: Medicare Other | Admitting: Hospice and Palliative Medicine

## 2023-05-26 ENCOUNTER — Inpatient Hospital Stay: Payer: Medicare Other | Attending: Oncology | Admitting: Oncology

## 2023-05-26 VITALS — BP 130/67 | HR 53 | Temp 98.1°F | Resp 16 | Ht 69.0 in | Wt 123.0 lb

## 2023-05-26 VITALS — BP 120/89 | HR 72 | Resp 16

## 2023-05-26 DIAGNOSIS — N289 Disorder of kidney and ureter, unspecified: Secondary | ICD-10-CM | POA: Insufficient documentation

## 2023-05-26 DIAGNOSIS — Z803 Family history of malignant neoplasm of breast: Secondary | ICD-10-CM | POA: Insufficient documentation

## 2023-05-26 DIAGNOSIS — R630 Anorexia: Secondary | ICD-10-CM | POA: Diagnosis not present

## 2023-05-26 DIAGNOSIS — C7801 Secondary malignant neoplasm of right lung: Secondary | ICD-10-CM | POA: Insufficient documentation

## 2023-05-26 DIAGNOSIS — D649 Anemia, unspecified: Secondary | ICD-10-CM | POA: Insufficient documentation

## 2023-05-26 DIAGNOSIS — R7989 Other specified abnormal findings of blood chemistry: Secondary | ICD-10-CM | POA: Insufficient documentation

## 2023-05-26 DIAGNOSIS — C7951 Secondary malignant neoplasm of bone: Secondary | ICD-10-CM | POA: Insufficient documentation

## 2023-05-26 DIAGNOSIS — Z515 Encounter for palliative care: Secondary | ICD-10-CM

## 2023-05-26 DIAGNOSIS — G893 Neoplasm related pain (acute) (chronic): Secondary | ICD-10-CM

## 2023-05-26 DIAGNOSIS — C786 Secondary malignant neoplasm of retroperitoneum and peritoneum: Secondary | ICD-10-CM | POA: Insufficient documentation

## 2023-05-26 DIAGNOSIS — C541 Malignant neoplasm of endometrium: Secondary | ICD-10-CM | POA: Insufficient documentation

## 2023-05-26 DIAGNOSIS — Z5111 Encounter for antineoplastic chemotherapy: Secondary | ICD-10-CM | POA: Insufficient documentation

## 2023-05-26 DIAGNOSIS — C787 Secondary malignant neoplasm of liver and intrahepatic bile duct: Secondary | ICD-10-CM | POA: Diagnosis not present

## 2023-05-26 DIAGNOSIS — Z86000 Personal history of in-situ neoplasm of breast: Secondary | ICD-10-CM | POA: Diagnosis not present

## 2023-05-26 LAB — CBC WITH DIFFERENTIAL (CANCER CENTER ONLY)
Abs Immature Granulocytes: 0.02 10*3/uL (ref 0.00–0.07)
Basophils Absolute: 0 10*3/uL (ref 0.0–0.1)
Basophils Relative: 0 %
Eosinophils Absolute: 0 10*3/uL (ref 0.0–0.5)
Eosinophils Relative: 1 %
HCT: 35 % — ABNORMAL LOW (ref 36.0–46.0)
Hemoglobin: 11 g/dL — ABNORMAL LOW (ref 12.0–15.0)
Immature Granulocytes: 0 %
Lymphocytes Relative: 28 %
Lymphs Abs: 1.6 10*3/uL (ref 0.7–4.0)
MCH: 30.6 pg (ref 26.0–34.0)
MCHC: 31.4 g/dL (ref 30.0–36.0)
MCV: 97.2 fL (ref 80.0–100.0)
Monocytes Absolute: 0.9 10*3/uL (ref 0.1–1.0)
Monocytes Relative: 15 %
Neutro Abs: 3.2 10*3/uL (ref 1.7–7.7)
Neutrophils Relative %: 56 %
Platelet Count: 279 10*3/uL (ref 150–400)
RBC: 3.6 MIL/uL — ABNORMAL LOW (ref 3.87–5.11)
RDW: 14.8 % (ref 11.5–15.5)
WBC Count: 5.8 10*3/uL (ref 4.0–10.5)
nRBC: 0 % (ref 0.0–0.2)

## 2023-05-26 LAB — CMP (CANCER CENTER ONLY)
ALT: 15 U/L (ref 0–44)
AST: 28 U/L (ref 15–41)
Albumin: 3.3 g/dL — ABNORMAL LOW (ref 3.5–5.0)
Alkaline Phosphatase: 107 U/L (ref 38–126)
Anion gap: 11 (ref 5–15)
BUN: 22 mg/dL (ref 8–23)
CO2: 21 mmol/L — ABNORMAL LOW (ref 22–32)
Calcium: 8.8 mg/dL — ABNORMAL LOW (ref 8.9–10.3)
Chloride: 105 mmol/L (ref 98–111)
Creatinine: 0.73 mg/dL (ref 0.44–1.00)
GFR, Estimated: >60 mL/min (ref >60)
Glucose, Bld: 112 mg/dL — ABNORMAL HIGH (ref 70–99)
Potassium: 4.3 mmol/L (ref 3.5–5.1)
Sodium: 137 mmol/L (ref 135–145)
Total Bilirubin: 0.5 mg/dL (ref 0.3–1.2)
Total Protein: 6.4 g/dL — ABNORMAL LOW (ref 6.5–8.1)

## 2023-05-26 MED ORDER — PROCHLORPERAZINE MALEATE 10 MG PO TABS
10.0000 mg | ORAL_TABLET | Freq: Once | ORAL | Status: AC
  Administered 2023-05-26: 10 mg via ORAL
  Filled 2023-05-26: qty 1

## 2023-05-26 MED ORDER — ZOLEDRONIC ACID 4 MG/5ML IV CONC
3.5000 mg | Freq: Once | INTRAVENOUS | Status: AC
  Administered 2023-05-26: 3.5 mg via INTRAVENOUS
  Filled 2023-05-26: qty 4.38

## 2023-05-26 MED ORDER — SODIUM CHLORIDE 0.9 % IV SOLN
Freq: Once | INTRAVENOUS | Status: AC
  Filled 2023-05-26: qty 250

## 2023-05-26 MED ORDER — SODIUM CHLORIDE 0.9 % IV SOLN
800.0000 mg/m2 | Freq: Once | INTRAVENOUS | Status: AC
  Administered 2023-05-26: 1330 mg via INTRAVENOUS
  Filled 2023-05-26: qty 8.68

## 2023-05-26 MED ORDER — SODIUM CHLORIDE 0.9% FLUSH
10.0000 mL | INTRAVENOUS | Status: AC | PRN
  Administered 2023-05-26: 10 mL via INTRAVENOUS
  Filled 2023-05-26: qty 10

## 2023-05-26 MED ORDER — SODIUM CHLORIDE 0.9 % IV SOLN
510.0000 mg | Freq: Once | INTRAVENOUS | Status: AC
  Administered 2023-05-26: 510 mg via INTRAVENOUS
  Filled 2023-05-26: qty 510

## 2023-05-26 NOTE — Progress Notes (Signed)
[facility]  Telephone:(336) [phone] [phone]) [phone]  ID: Olivia Burton OB: 01/08/1941  MR#: [id number]  AOZ#:[id number]  Patient Care Team: Kandyce Rud, MD as PCP - General (Family Medicine) Benita Gutter, RN as Oncology Nurse Navigator Orlie Dakin, Tollie Pizza, MD as Consulting Physician (Oncology)  CHIEF COMPLAINT: Progressive stage IV uterine cancer  INTERVAL HISTORY: Patient returns to clinic today for further evaluation and initiation of cycle 1, day 1 of single agent gemcitabine.  She continues to have chronic weakness and fatigue.  She has noticed increased vaginal bleeding today.  She denies any pain.  She has no neurologic complaints.  She denies any recent fevers.  She has a good appetite and denies weight loss.  She has no chest pain, shortness of breath, cough, or hemoptysis.  She denies any nausea, vomiting, constipation, or diarrhea.  She has no urinary complaints.  Patient offers no further specific complaints today.  REVIEW OF SYSTEMS:   Review of Systems  Constitutional:  Positive for malaise/fatigue. Negative for fever and weight loss.  Respiratory: Negative.  Negative for cough, hemoptysis and shortness of breath.   Cardiovascular: Negative.  Negative for chest pain and leg swelling.  Gastrointestinal: Negative.  Negative for abdominal pain.  Genitourinary: Negative.  Negative for dysuria and hematuria.  Musculoskeletal: Negative.  Negative for back pain.  Skin: Negative.  Negative for rash.  Neurological:  Positive for weakness. Negative for dizziness, focal weakness and headaches.  Psychiatric/Behavioral:  The patient is not nervous/anxious.     As per HPI. Otherwise, a complete review of systems is negative.  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   DCIS (ductal carcinoma in situ)    Osteoarthritis    Personal history of radiation therapy     PAST SURGICAL  HISTORY: Past Surgical History:  Procedure Laterality Date   BREAST BIOPSY Right 2012   +   BREAST LUMPECTOMY Right 2012   BUNIONECTOMY     CERVICAL LAMINECTOMY     ROTATOR CUFF REPAIR      FAMILY HISTORY: Family History  Problem Relation Age of Onset   Breast cancer Mother 93   Coronary artery disease Mother    Hypertension Mother    Stroke Mother    Heart attack Father     ADVANCED DIRECTIVES (Y/N):  N  HEALTH MAINTENANCE: Social History   Tobacco Use   Smoking status: Never   Smokeless tobacco: Never  Substance Use Topics   Alcohol use: Never   Drug use: Never     Colonoscopy:  PAP:  Bone density:  Lipid panel:  Allergies  Allergen Reactions   Quinine Hives    Current Outpatient Medications  Medication Sig Dispense Refill   Acetaminophen (TYLENOL) 325 MG CAPS Take by mouth.     dicyclomine (BENTYL) 20 MG tablet Take by mouth.     diphenhydrAMINE (BENADRYL) 25 mg capsule Take 25 mg by mouth every 6 (six) hours as needed.     levothyroxine (SYNTHROID) 25 MCG tablet Take 25 mcg by mouth every morning.     lidocaine-prilocaine (EMLA) cream Apply to affected area once 30 g 3   ondansetron (ZOFRAN) 8 MG tablet Take 8 mg by mouth every 8 (eight) hours as needed.     ondansetron (ZOFRAN) 8 MG tablet Take 1 tablet (8 mg total) by mouth every 8 (eight) hours as needed for nausea or vomiting. 60 tablet 2   polyethylene glycol (  MIRALAX) 17 g packet Take by mouth.     senna (SENOKOT) 8.6 MG tablet Take 1 tablet by mouth at bedtime.     traMADol (ULTRAM) 50 MG tablet Take 1 tablet (50 mg total) by mouth every 6 (six) hours as needed. 30 tablet 0   trimethoprim-polymyxin b (POLYTRIM) ophthalmic solution Place 1 drop into both eyes every 4 (four) hours.     apixaban (ELIQUIS) 5 MG TABS tablet Take 5 mg by mouth 2 (two) times daily.  (Patient not taking: Reported on 04/28/2023)     B Complex-Folic Acid (SM BALANCED B-100) TABS Take 1 tablet by mouth daily.  (Patient not  taking: Reported on 04/28/2023)     Biotin 1000 MCG tablet Take 1,000 mcg by mouth daily.  (Patient not taking: Reported on 04/28/2023)     calcium carbonate (TUMS - DOSED IN MG ELEMENTAL CALCIUM) 500 MG chewable tablet Chew 1 tablet by mouth daily.  (Patient not taking: Reported on 04/28/2023)     CO ENZYME Q-10 PO Take 1 capsule by mouth daily.  (Patient not taking: Reported on 04/28/2023)     Cyanocobalamin (VITAMIN B 12 PO) Take 1 tablet by mouth daily.  (Patient not taking: Reported on 04/28/2023)     latanoprost (XALATAN) 0.005 % ophthalmic solution Place 1 drop into both eyes at bedtime. (Patient not taking: Reported on 04/28/2023)  5   Multiple Vitamin (MULTI-VITAMINS) TABS Take 1 tablet by mouth daily.  (Patient not taking: Reported on 04/28/2023)     Red Yeast Rice 600 MG TABS Take 2 tablets by mouth daily.  (Patient not taking: Reported on 04/28/2023)     rosuvastatin (CRESTOR) 10 MG tablet Take 10 mg by mouth daily.     SIMBRINZA 1-0.2 % SUSP INSTILL 1 DROP INTO BOTH EYES TWICE A DAY (Patient not taking: Reported on 04/28/2023)  5   vitamin C (ASCORBIC ACID) 500 MG tablet Take 500 mg by mouth daily.  (Patient not taking: Reported on 04/28/2023)     No current facility-administered medications for this visit.   Facility-Administered Medications Ordered in Other Visits  Medication Dose Route Frequency Provider Last Rate Last Admin   sodium chloride flush (NS) 0.9 % injection 10 mL  10 mL Intravenous PRN Jeralyn Ruths, MD   10 mL at 05/26/23 0927    OBJECTIVE: Vitals:   05/26/23 0939  BP: 130/67  Resp: 16  Temp: 98.1 F (36.7 C)  SpO2: 100%     Body mass index is 18.16 kg/m.    ECOG FS:1 - Symptomatic but completely ambulatory  General: Well-developed, well-nourished, no acute distress. Eyes: Pink conjunctiva, anicteric sclera. HEENT: Normocephalic, moist mucous membranes. Lungs: No audible wheezing or coughing. Heart: Regular rate and rhythm. Abdomen: Soft, nontender, no obvious  distention. Musculoskeletal: No edema, cyanosis, or clubbing. Neuro: Alert, answering all questions appropriately. Cranial nerves grossly intact. Skin: No rashes or petechiae noted. Psych: Normal affect.  LAB RESULTS:  Lab Results  Component Value Date   NA 137 05/26/2023   K 4.3 05/26/2023   CL 105 05/26/2023   CO2 21 (L) 05/26/2023   GLUCOSE 112 (H) 05/26/2023   BUN 22 05/26/2023   CREATININE 0.73 05/26/2023   CALCIUM 8.8 (L) 05/26/2023   PROT 6.4 (L) 05/26/2023   ALBUMIN 3.3 (L) 05/26/2023   AST 28 05/26/2023   ALT 15 05/26/2023   ALKPHOS 107 05/26/2023   BILITOT 0.5 05/26/2023   GFRNONAA >60 05/26/2023   GFRAA >60 05/12/2018    Lab  Results  Component Value Date   WBC 5.8 05/26/2023   NEUTROABS 3.2 05/26/2023   HGB 11.0 (L) 05/26/2023   HCT 35.0 (L) 05/26/2023   MCV 97.2 05/26/2023   PLT 279 05/26/2023     STUDIES: NM PET Image Initial (PI) Skull Base To Thigh  Result Date: 05/11/2023 CLINICAL DATA:  Subsequent treatment strategy for endometrial/cervical cancer. Status post hysterectomy in 2002. History of right breast cancer, status post lumpectomy in 2009. EXAM: NUCLEAR MEDICINE PET SKULL BASE TO THIGH TECHNIQUE: 6.4 mCi F-18 FDG was injected intravenously. Full-ring PET imaging was performed from the skull base to thigh after the radiotracer. CT data was obtained and used for attenuation correction and anatomic localization. Fasting blood glucose: 101 mg/dl COMPARISON:  Outside hospital [facility]) CT abdomen/pelvis dated 03/23/2023). Outside hospital [facility]) CT chest dated 09/29/2022. FINDINGS: Mediastinal blood pool activity: SUV max 2.0 Liver activity: SUV max NA NECK: No hypermetabolic cervical lymphadenopathy. Incidental CT findings: None. CHEST: 2.1 x 1.9 cm medial right lower lobe nodule (series 7/image 60), previously 1.6 x 1.5 cm, max SUV 14.6, compatible with pulmonary metastasis. No hypermetabolic thoracic  lymphadenopathy. Right chest port terminates in the upper right atrium. Incidental CT findings: Cardiomegaly. Atherosclerotic calcifications of the aortic arch. Moderate three-vessel coronary atherosclerosis. ABDOMEN/PELVIS: Abnormal soft tissue along the left vaginal cuff, measuring approximately 1.7 x 2.5 cm (series 3/image 214), similar to the prior, max SUV 11.3, reflecting local recurrence. Dominant 2.0 cm peritoneal implant along the left mid abdomen (series 3/image 166), previously 1.9 cm, max SUV 10.8. Additional implants along the left lateral/upper abdominal wall with max SUV 11.9 and along the anterior abdominal mesentery with max SUV 11.0. Suspected peritoneal implant versus hepatic metastasis along the falciform ligament, poorly visualized on unenhanced CT but measuring 14 mm on the prior enhanced study, max SUV 7.9. 6 mm short axis pericaval node (series 3/image 141), max SUV 5.5. 9 mm short axis right external iliac node versus pelvic peritoneal metastasis (series 3/image 206), max SUV 8.6. No abnormal hypermetabolism in the spleen, pancreas, or adrenal glands. Incidental CT findings: Benign hepatic cysts. Gallbladder distension. Atherosclerotic calcifications of the abdominal aorta and branch vessels. Trace ascites along the inferior right liver margin. SKELETON: Multifocal osseous metastases, including: --Right posteromedial 5th rib, max SUV 11.9 --Right lateral T6 vertebral body, max SUV 12.4 --Left sacral ala, max SUV 4.6 --Left iliac bone max SUV 4.1 --Right proximal femur, max SUV 4.0 Incidental CT findings: Degenerative changes of the visualized thoracolumbar spine. IMPRESSION: Local recurrence along the left vaginal cuff. Multifocal peritoneal implants with small retroperitoneal/pelvic nodes, as above. 2.1 cm right lower lobe metastasis, increased. Multifocal osseous metastases, as above. Electronically Signed   By: Charline Bills M.D.   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  She is also undergone multiple rounds of treatment which included carboplatin and Taxol, Doxil, Keytruda/lenvatinib, and more recently Enhertu.  Patient has also had pelvic XRT.  Patient reports she has been off treatment since approximately March 2024.  PET scan results from May 11, 2023 reviewed independently and reported as above with localized recurrence as well as multifocal peritoneal implants, retroperitoneal lymphadenopathy, and multifocal osseous metastasis. Hospice and end-of-life were previously discussed, but patient declined and wishes to pursue treatment.  Plan is to give single agent dose reduced  gemcitabine on days 1 and 8 with day 15 off.  This will be a 21-day cycle.  Patient and her family expressed understanding that there are likely no further treatment options after this.  Proceed with cycle 1, day 1 of treatment today.  Return to clinic in 1 week for further evaluation and consideration of cycle 2.  Appreciate palliative care input.   Bony metastasis: Patient will receive Zometa on odd-numbered cycles. Vaginal bleeding: Appreciate gyn-onc input.  Patient has discontinued Eliquis.  Patient received Feraheme today and will receive the second dose in [redacted] week along with her chemotherapy. Anemia: Mild.  Patient's hemoglobin is 11.0.  Feraheme as above. History of DCIS: Patient completed tamoxifen in approximately 2019.   Patient expressed understanding and was in agreement with this plan. She also understands that She can call clinic at any time with any questions, concerns, or complaints.    Cancer Staging  Endometrial cancer [facility]) Staging form: Corpus Uteri - Carcinoma and Carcinosarcoma, AJCC 8th Edition - Clinical stage from 04/28/2023: FIGO Stage IVB (cTX, cNX, pM1) - Signed by Jeralyn Ruths, MD on 04/28/2023 Stage prefix:  Initial diagnosis   Jeralyn Ruths, MD   05/26/2023 10:05 AM

## 2023-05-26 NOTE — Progress Notes (Signed)
Palliative Medicine [facility] Telephone:(336) [phone] Fax:(336) [phone]   Name: Olivia Burton Date: 05/26/2023 MRN: [id number]  DOB: [birth year]  Patient Care Team: Kandyce Rud, MD as PCP - General (Family Medicine) Benita Gutter, RN as Oncology Nurse Navigator Orlie Dakin, Tollie Pizza, MD as Consulting Physician (Oncology)    REASON FOR CONSULTATION: Olivia Burton is a 82 y.o. 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  Patient is status post multiple previous lines of treatment.  Treatment was ultimately held due to declining performance status.  She was referred to palliative care to address goals.  SOCIAL HISTORY:     reports that she has never smoked. She has never used smokeless tobacco. She reports that she does not drink alcohol and does not use drugs.  Patient is married lives at home with her husband who is her primary caregiver.  She also has a son who is involved.  ADVANCE DIRECTIVES:    CODE STATUS:   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   DCIS (ductal carcinoma in situ)    Osteoarthritis    Personal history of radiation therapy     PAST SURGICAL HISTORY:  Past Surgical History:  Procedure Laterality Date   BREAST BIOPSY Right 2012   +   BREAST LUMPECTOMY Right 2012   BUNIONECTOMY     CERVICAL LAMINECTOMY     ROTATOR CUFF REPAIR      HEMATOLOGY/ONCOLOGY HISTORY:  Oncology History  Endometrial cancer (HCC)  04/28/2023 Initial Diagnosis   Endometrial cancer (HCC)   04/28/2023 Cancer Staging   Staging form: Corpus Uteri - Carcinoma and Carcinosarcoma, AJCC 8th Edition - Clinical stage from 04/28/2023: FIGO Stage IVB (cTX, cNX, pM1) - Signed by Jeralyn Ruths, MD on 04/28/2023 Stage prefix: Initial diagnosis   05/26/2023 -  Chemotherapy   Patient  is on Treatment Plan : Uterine Gemcitabine D1,8 (800) q21d x 6 Cycles       ALLERGIES:  is allergic to quinine.  MEDICATIONS:  Current Outpatient Medications  Medication Sig Dispense Refill   Acetaminophen (TYLENOL) 325 MG CAPS Take by mouth.     apixaban (ELIQUIS) 5 MG TABS tablet Take 5 mg by mouth 2 (two) times daily.  (Patient not taking: Reported on 04/28/2023)     B Complex-Folic Acid (SM BALANCED B-100) TABS Take 1 tablet by mouth daily.  (Patient not taking: Reported on 04/28/2023)     Biotin 1000 MCG tablet Take 1,000 mcg by mouth daily.  (Patient not taking: Reported on 04/28/2023)     calcium carbonate (TUMS - DOSED IN MG ELEMENTAL CALCIUM) 500 MG chewable tablet Chew 1 tablet by mouth daily.  (Patient not taking: Reported on 04/28/2023)     CO ENZYME Q-10 PO Take 1 capsule by mouth daily.  (Patient not taking: Reported on 04/28/2023)     Cyanocobalamin (VITAMIN B 12 PO) Take 1 tablet by mouth daily.  (Patient not taking: Reported on 04/28/2023)     dicyclomine (BENTYL) 20 MG tablet Take by mouth.     diphenhydrAMINE (BENADRYL) 25 mg capsule Take 25 mg by mouth every 6 (six) hours as needed.     latanoprost (XALATAN) 0.005 % ophthalmic solution Place 1 drop into both eyes at bedtime. (Patient not taking: Reported on 04/28/2023)  5   levothyroxine (SYNTHROID) 25 MCG tablet  Take 25 mcg by mouth every morning.     lidocaine-prilocaine (EMLA) cream Apply to affected area once 30 g 3   Multiple Vitamin (MULTI-VITAMINS) TABS Take 1 tablet by mouth daily.  (Patient not taking: Reported on 04/28/2023)     ondansetron (ZOFRAN) 8 MG tablet Take 8 mg by mouth every 8 (eight) hours as needed.     ondansetron (ZOFRAN) 8 MG tablet Take 1 tablet (8 mg total) by mouth every 8 (eight) hours as needed for nausea or vomiting. 60 tablet 2   polyethylene glycol (MIRALAX) 17 g packet Take by mouth.     Red Yeast Rice 600 MG TABS Take 2 tablets by mouth daily.  (Patient not taking: Reported on 04/28/2023)      rosuvastatin (CRESTOR) 10 MG tablet Take 10 mg by mouth daily.     senna (SENOKOT) 8.6 MG tablet Take 1 tablet by mouth at bedtime.     SIMBRINZA 1-0.2 % SUSP INSTILL 1 DROP INTO BOTH EYES TWICE A DAY (Patient not taking: Reported on 04/28/2023)  5   traMADol (ULTRAM) 50 MG tablet Take 1 tablet (50 mg total) by mouth every 6 (six) hours as needed. 30 tablet 0   trimethoprim-polymyxin b (POLYTRIM) ophthalmic solution Place 1 drop into both eyes every 4 (four) hours.     vitamin C (ASCORBIC ACID) 500 MG tablet Take 500 mg by mouth daily.  (Patient not taking: Reported on 04/28/2023)     No current facility-administered medications for this visit.   Facility-Administered Medications Ordered in Other Visits  Medication Dose Route Frequency Provider Last Rate Last Admin   sodium chloride flush (NS) 0.9 % injection 10 mL  10 mL Intravenous PRN Jeralyn Ruths, MD   10 mL at 05/26/23 1610    VITAL SIGNS: There were no vitals taken for this visit. There were no vitals filed for this visit.  Estimated body mass index is 18.16 kg/m as calculated from the following:   Height as of an earlier encounter on 05/26/23: 5\' 9"  (1.753 m).   Weight as of an earlier encounter on 05/26/23: 123 lb (55.8 kg).  LABS: CBC:    Component Value Date/Time   WBC 5.8 05/26/2023 0923   WBC 3.9 05/12/2018 1857   HGB 11.0 (L) 05/26/2023 0923   HGB 12.9 01/03/2013 1105   HCT 35.0 (L) 05/26/2023 0923   HCT 38.4 01/03/2013 1105   PLT 279 05/26/2023 0923   PLT 179 01/03/2013 1105   MCV 97.2 05/26/2023 0923   MCV 88 01/03/2013 1105   NEUTROABS 3.2 05/26/2023 0923   NEUTROABS 1.2 (L) 01/03/2013 1105   LYMPHSABS 1.6 05/26/2023 0923   LYMPHSABS 1.2 01/03/2013 1105   MONOABS 0.9 05/26/2023 0923   MONOABS 0.4 01/03/2013 1105   EOSABS 0.0 05/26/2023 0923   EOSABS 0.1 01/03/2013 1105   BASOSABS 0.0 05/26/2023 0923   BASOSABS 0.0 01/03/2013 1105   Comprehensive Metabolic Panel:    Component Value Date/Time   NA 137  05/26/2023 0923   K 4.3 05/26/2023 0923   CL 105 05/26/2023 0923   CO2 21 (L) 05/26/2023 0923   BUN 22 05/26/2023 0923   CREATININE 0.73 05/26/2023 0923   GLUCOSE 112 (H) 05/26/2023 0923   CALCIUM 8.8 (L) 05/26/2023 0923   AST 28 05/26/2023 0923   ALT 15 05/26/2023 0923   ALKPHOS 107 05/26/2023 0923   BILITOT 0.5 05/26/2023 0923   PROT 6.4 (L) 05/26/2023 0923   ALBUMIN 3.3 (L) 05/26/2023 9604  RADIOGRAPHIC STUDIES: NM PET Image Initial (PI) Skull Base To Thigh  Result Date: 05/11/2023 CLINICAL DATA:  Subsequent treatment strategy for endometrial/cervical cancer. Status post hysterectomy in 2002. History of right breast cancer, status post lumpectomy in 2009. EXAM: NUCLEAR MEDICINE PET SKULL BASE TO THIGH TECHNIQUE: 6.4 mCi F-18 FDG was injected intravenously. Full-ring PET imaging was performed from the skull base to thigh after the radiotracer. CT data was obtained and used for attenuation correction and anatomic localization. Fasting blood glucose: 101 mg/dl COMPARISON:  Outside hospital [facility]) CT abdomen/pelvis dated 03/23/2023). [facility] CT chest dated 09/29/2022. FINDINGS: Mediastinal blood pool activity: SUV max 2.0 Liver activity: SUV max NA NECK: No hypermetabolic cervical lymphadenopathy. Incidental CT findings: None. CHEST: 2.1 x 1.9 cm medial right lower lobe nodule (series 7/image 60), previously 1.6 x 1.5 cm, max SUV 14.6, compatible with pulmonary metastasis. No hypermetabolic thoracic lymphadenopathy. Right chest port terminates in the upper right atrium. Incidental CT findings: Cardiomegaly. Atherosclerotic calcifications of the aortic arch. Moderate three-vessel coronary atherosclerosis. ABDOMEN/PELVIS: Abnormal soft tissue along the left vaginal cuff, measuring approximately 1.7 x 2.5 cm (series 3/image 214), similar to the prior, max SUV 11.3, reflecting local recurrence. Dominant 2.0 cm peritoneal implant  along the left mid abdomen (series 3/image 166), previously 1.9 cm, max SUV 10.8. Additional implants along the left lateral/upper abdominal wall with max SUV 11.9 and along the anterior abdominal mesentery with max SUV 11.0. Suspected peritoneal implant versus hepatic metastasis along the falciform ligament, poorly visualized on unenhanced CT but measuring 14 mm on the prior enhanced study, max SUV 7.9. 6 mm short axis pericaval node (series 3/image 141), max SUV 5.5. 9 mm short axis right external iliac node versus pelvic peritoneal metastasis (series 3/image 206), max SUV 8.6. No abnormal hypermetabolism in the spleen, pancreas, or adrenal glands. Incidental CT findings: Benign hepatic cysts. Gallbladder distension. Atherosclerotic calcifications of the abdominal aorta and branch vessels. Trace ascites along the inferior right liver margin. SKELETON: Multifocal osseous metastases, including: --Right posteromedial 5th rib, max SUV 11.9 --Right lateral T6 vertebral body, max SUV 12.4 --Left sacral ala, max SUV 4.6 --Left iliac bone max SUV 4.1 --Right proximal femur, max SUV 4.0 Incidental CT findings: Degenerative changes of the visualized thoracolumbar spine. IMPRESSION: Local recurrence along the left vaginal cuff. Multifocal peritoneal implants with small retroperitoneal/pelvic nodes, as above. 2.1 cm right lower lobe metastasis, increased. Multifocal osseous metastases, as above. Electronically Signed   By: Charline Bills M.D.   On: 05/11/2023 00:05    PERFORMANCE STATUS (ECOG) : 2 - Symptomatic, <50% confined to bed  Review of Systems Unless otherwise noted, a complete review of systems is negative.  Physical Exam General: NAD Pulmonary: Unlabored Extremities: no edema, no joint deformities Skin: no rashes Neurological: Weakness but otherwise nonfocal  IMPRESSION: PET scan 05/06/2023 revealed local recurrence along the left vaginal cuff, multifocal peritoneal implants, increased right lower  lobe metastasis, and multifocal osseous metastases.  Patient receiving cycle 1 gemcitabine today.  Has been reports that pain is somewhat improved today.  Since rotating from Norco to tramadol, patient has had less drowsiness but husband is primarily giving medication at bedtime.  During the day, he is utilizing acetaminophen and would like to add ibuprofen.  Discussed pain regimen in detail.  I suggested that he could give half a tablet of tramadol if he feels the full tablet dose is too strong.     PLAN: -Continue current scope of treatment -Continue  tramadol as needed for pain -Will benefit from ACP/MOST Form -RTC 2 to 3 weeks  Case and plan discussed with Dr. Orlie Dakin   Patient expressed understanding and was in agreement with this plan. She also understands that She can call the clinic at any time with any questions, concerns, or complaints.     Time Total: 15 minutes  Visit consisted of counseling and education dealing with the complex and emotionally intense issues of symptom management and palliative care in the setting of serious and potentially life-threatening illness.Greater than 50%  of this time was spent counseling and coordinating care related to the above assessment and plan.  Signed by: Laurette Schimke, PhD, NP-C

## 2023-05-26 NOTE — Patient Instructions (Signed)
[facility] [facility]  Discharge Instructions: Thank you for choosing [facility] to provide your oncology and hematology care.  If you have a lab appointment with the Cancer Center, please go directly to the Cancer Center and check in at the registration area.  Wear comfortable clothing and clothing appropriate for easy access to any Portacath or PICC line.   We strive to give you quality time with your provider. You may need to reschedule your appointment if you arrive late (15 or more minutes).  Arriving late affects you and other patients whose appointments are after yours.  Also, if you miss three or more appointments without notifying the office, you may be dismissed from the clinic at the provider's discretion.      For prescription refill requests, have your pharmacy contact our office and allow 72 hours for refills to be completed.    Today you received the following chemotherapy and/or immunotherapy agents Gemzar       To help prevent nausea and vomiting after your treatment, we encourage you to take your nausea medication as directed.  BELOW ARE SYMPTOMS THAT SHOULD BE REPORTED IMMEDIATELY: *FEVER GREATER THAN 100.4 F (38 C) OR HIGHER *CHILLS OR SWEATING *NAUSEA AND VOMITING THAT IS NOT CONTROLLED WITH YOUR NAUSEA MEDICATION *UNUSUAL SHORTNESS OF BREATH *UNUSUAL BRUISING OR BLEEDING *URINARY PROBLEMS (pain or burning when urinating, or frequent urination) *BOWEL PROBLEMS (unusual diarrhea, constipation, pain near the anus) TENDERNESS IN MOUTH AND THROAT WITH OR WITHOUT PRESENCE OF ULCERS (sore throat, sores in mouth, or a toothache) UNUSUAL RASH, SWELLING OR PAIN  UNUSUAL VAGINAL DISCHARGE OR ITCHING   Items with * indicate a potential emergency and should be followed up as soon as possible or go to the Emergency Department if any problems should occur.  Please show the CHEMOTHERAPY ALERT CARD or IMMUNOTHERAPY ALERT CARD at check-in to  the Emergency Department and triage nurse.  Should you have questions after your visit or need to cancel or reschedule your appointment, please contact [facility]  [phone] and follow the prompts.  Office hours are 8:00 a.m. to 4:30 p.m. Monday - Friday. Please note that voicemails left after 4:00 p.m. may not be returned until the following business day.  We are closed weekends and major holidays. You have access to a nurse at all times for urgent questions. Please call the main number to the clinic [phone] and follow the prompts.  For any non-urgent questions, you may also contact your provider using MyChart. We now offer e-Visits for anyone 18 and older to request care online for non-urgent symptoms. For details visit [email or web address].   Also download the MyChart app! Go to the app store, search "MyChart", open the app, select , and log in with your MyChart username and password.    

## 2023-05-26 NOTE — Progress Notes (Signed)
Having abdominal pain. Feels like cramping feeling. Has vaginal bleeding. Having heavier discharge. Does describe cramping as menstrual cycle cramps. Was given tramadol for pain but that does not seem to help much. SOB has increased. Does complain of headaches from time to time that are abnormal for her. States she has a "funny feeling" in her feet like they are "drawing up" and feels hot.

## 2023-05-31 ENCOUNTER — Other Ambulatory Visit: Payer: Self-pay | Admitting: Nurse Practitioner

## 2023-05-31 DIAGNOSIS — N939 Abnormal uterine and vaginal bleeding, unspecified: Secondary | ICD-10-CM

## 2023-05-31 DIAGNOSIS — C541 Malignant neoplasm of endometrium: Secondary | ICD-10-CM

## 2023-05-31 NOTE — Progress Notes (Signed)
Patient experiencing vaginal bleeding secondary to known malignancy. She has started palliative chemo with Dr Orlie Dakin, gemcitabine. Dr Johnnette Litter recommends possible radiation if she is a candidate. Spoke to Dr Rushie Chestnut who feels that patient would be candidate for radiation d/t bleeding related to known malignancy. Ref to rad onc sent.

## 2023-06-01 ENCOUNTER — Other Ambulatory Visit: Payer: Self-pay | Admitting: Oncology

## 2023-06-01 DIAGNOSIS — C541 Malignant neoplasm of endometrium: Secondary | ICD-10-CM

## 2023-06-02 ENCOUNTER — Encounter: Payer: Self-pay | Admitting: Oncology

## 2023-06-02 ENCOUNTER — Ambulatory Visit: Payer: Medicare Other

## 2023-06-02 ENCOUNTER — Inpatient Hospital Stay (HOSPITAL_BASED_OUTPATIENT_CLINIC_OR_DEPARTMENT_OTHER): Payer: Medicare Other | Admitting: Hospice and Palliative Medicine

## 2023-06-02 ENCOUNTER — Inpatient Hospital Stay: Payer: Medicare Other | Admitting: Oncology

## 2023-06-02 ENCOUNTER — Inpatient Hospital Stay: Payer: Medicare Other

## 2023-06-02 ENCOUNTER — Ambulatory Visit
Admission: RE | Admit: 2023-06-02 | Discharge: 2023-06-02 | Disposition: A | Discharge status: Home / Self Care | Payer: Medicare Other | Source: Ambulatory Visit | Attending: Radiation Oncology | Admitting: Radiation Oncology

## 2023-06-02 ENCOUNTER — Other Ambulatory Visit: Payer: Self-pay | Admitting: Oncology

## 2023-06-02 ENCOUNTER — Encounter: Payer: Self-pay | Admitting: Radiation Oncology

## 2023-06-02 DIAGNOSIS — C7982 Secondary malignant neoplasm of genital organs: Secondary | ICD-10-CM | POA: Diagnosis present

## 2023-06-02 DIAGNOSIS — Z7901 Long term (current) use of anticoagulants: Secondary | ICD-10-CM | POA: Insufficient documentation

## 2023-06-02 DIAGNOSIS — C541 Malignant neoplasm of endometrium: Secondary | ICD-10-CM

## 2023-06-02 DIAGNOSIS — Z853 Personal history of malignant neoplasm of breast: Secondary | ICD-10-CM | POA: Insufficient documentation

## 2023-06-02 DIAGNOSIS — Z803 Family history of malignant neoplasm of breast: Secondary | ICD-10-CM | POA: Insufficient documentation

## 2023-06-02 DIAGNOSIS — Z515 Encounter for palliative care: Secondary | ICD-10-CM | POA: Diagnosis not present

## 2023-06-02 DIAGNOSIS — C7951 Secondary malignant neoplasm of bone: Secondary | ICD-10-CM | POA: Diagnosis not present

## 2023-06-02 DIAGNOSIS — Z7952 Long term (current) use of systemic steroids: Secondary | ICD-10-CM | POA: Insufficient documentation

## 2023-06-02 DIAGNOSIS — Z5111 Encounter for antineoplastic chemotherapy: Secondary | ICD-10-CM | POA: Diagnosis not present

## 2023-06-02 DIAGNOSIS — M1990 Unspecified osteoarthritis, unspecified site: Secondary | ICD-10-CM | POA: Diagnosis not present

## 2023-06-02 DIAGNOSIS — Z79899 Other long term (current) drug therapy: Secondary | ICD-10-CM | POA: Insufficient documentation

## 2023-06-02 DIAGNOSIS — Z923 Personal history of irradiation: Secondary | ICD-10-CM | POA: Diagnosis not present

## 2023-06-02 LAB — CMP (CANCER CENTER ONLY)
ALT: 19 U/L (ref 0–44)
AST: 45 U/L — ABNORMAL HIGH (ref 15–41)
Albumin: 3.1 g/dL — ABNORMAL LOW (ref 3.5–5.0)
Alkaline Phosphatase: 121 U/L (ref 38–126)
Anion gap: 8 (ref 5–15)
BUN: 16 mg/dL (ref 8–23)
CO2: 19 mmol/L — ABNORMAL LOW (ref 22–32)
Calcium: 7 mg/dL — ABNORMAL LOW (ref 8.9–10.3)
Chloride: 111 mmol/L (ref 98–111)
Creatinine: 1.19 mg/dL — ABNORMAL HIGH (ref 0.44–1.00)
GFR, Estimated: 46 mL/min — ABNORMAL LOW (ref >60)
Glucose, Bld: 114 mg/dL — ABNORMAL HIGH (ref 70–99)
Potassium: 4.3 mmol/L (ref 3.5–5.1)
Sodium: 138 mmol/L (ref 135–145)
Total Bilirubin: 0.3 mg/dL (ref 0.3–1.2)
Total Protein: 6.5 g/dL (ref 6.5–8.1)

## 2023-06-02 LAB — CBC WITH DIFFERENTIAL (CANCER CENTER ONLY)
Abs Immature Granulocytes: 0.02 10*3/uL (ref 0.00–0.07)
Basophils Absolute: 0 10*3/uL (ref 0.0–0.1)
Basophils Relative: 0 %
Eosinophils Absolute: 0 10*3/uL (ref 0.0–0.5)
Eosinophils Relative: 0 %
HCT: 34.9 % — ABNORMAL LOW (ref 36.0–46.0)
Hemoglobin: 10.9 g/dL — ABNORMAL LOW (ref 12.0–15.0)
Immature Granulocytes: 1 %
Lymphocytes Relative: 31 %
Lymphs Abs: 1.2 10*3/uL (ref 0.7–4.0)
MCH: 30.7 pg (ref 26.0–34.0)
MCHC: 31.2 g/dL (ref 30.0–36.0)
MCV: 98.3 fL (ref 80.0–100.0)
Monocytes Absolute: 0.4 10*3/uL (ref 0.1–1.0)
Monocytes Relative: 11 %
Neutro Abs: 2.2 10*3/uL (ref 1.7–7.7)
Neutrophils Relative %: 57 %
Platelet Count: 168 10*3/uL (ref 150–400)
RBC: 3.55 MIL/uL — ABNORMAL LOW (ref 3.87–5.11)
RDW: 14.6 % (ref 11.5–15.5)
WBC Count: 3.8 10*3/uL — ABNORMAL LOW (ref 4.0–10.5)
nRBC: 0 % (ref 0.0–0.2)

## 2023-06-02 LAB — MAGNESIUM: Magnesium: 2.6 mg/dL — ABNORMAL HIGH (ref 1.7–2.4)

## 2023-06-02 MED ORDER — HEPARIN SOD (PORK) LOCK FLUSH 100 UNIT/ML IV SOLN
500.0000 [IU] | Freq: Once | INTRAVENOUS | Status: AC | PRN
  Administered 2023-06-02: 500 [IU]
  Filled 2023-06-02: qty 5

## 2023-06-02 MED ORDER — OYSTER SHELL CALCIUM/D3 500-5 MG-MCG PO TABS: 1.0000 | ORAL_TABLET | Freq: Every day | ORAL | 1 refills | Status: DC

## 2023-06-02 MED ORDER — PROCHLORPERAZINE MALEATE 10 MG PO TABS
10.0000 mg | ORAL_TABLET | Freq: Once | ORAL | Status: AC
  Administered 2023-06-02: 10 mg via ORAL
  Filled 2023-06-02: qty 1

## 2023-06-02 MED ORDER — SODIUM CHLORIDE 0.9 % IV SOLN
800.0000 mg/m2 | Freq: Once | INTRAVENOUS | Status: AC
  Administered 2023-06-02: 1330 mg via INTRAVENOUS
  Filled 2023-06-02: qty 34.98

## 2023-06-02 MED ORDER — HYDROXYZINE HCL 10 MG PO TABS: 5.0000 mg | ORAL_TABLET | Freq: Three times a day (TID) | ORAL | 0 refills | Status: DC | PRN

## 2023-06-02 MED ORDER — SODIUM CHLORIDE 0.9 % IV SOLN
Freq: Once | INTRAVENOUS | Status: AC
  Filled 2023-06-02: qty 250

## 2023-06-02 MED ORDER — DEXAMETHASONE 2 MG PO TABS: 2.0000 mg | ORAL_TABLET | Freq: Every day | ORAL | 1 refills | Status: DC

## 2023-06-02 MED ORDER — SODIUM CHLORIDE 0.9 % IV SOLN
510.0000 mg | Freq: Once | INTRAVENOUS | Status: AC
  Administered 2023-06-02: 510 mg via INTRAVENOUS
  Filled 2023-06-02: qty 510

## 2023-06-02 NOTE — Progress Notes (Signed)
[facility]  [phone] [phone]  ID: ERMON SARVIS OB: [birth year]  MR#: [id number]  NGE#:[id number]  Patient Care Team: Kandyce Rud, MD as PCP - General (Family Medicine) Benita Gutter, RN as Oncology Nurse Navigator Orlie Dakin, Tollie Pizza, MD as Consulting Physician (Oncology)  CHIEF COMPLAINT: Progressive stage IV uterine cancer  INTERVAL HISTORY: Patient returns to clinic today for further evaluation and consideration of cycle 1, day 8 of single agent gemcitabine.  She tolerated her first infusion well, but admits over the last several days she has had increasing weakness and fatigue and a poor appetite.  She denies any pain.  She has no neurologic complaints.  She denies any recent fevers. She has no chest pain, shortness of breath, cough, or hemoptysis.  She denies any nausea, vomiting, constipation, or diarrhea.  She has no urinary complaints.  Patient offers no further specific complaints today.  REVIEW OF SYSTEMS:   Review of Systems  Constitutional:  Positive for malaise/fatigue. Negative for fever and weight loss.  Respiratory: Negative.  Negative for cough, hemoptysis and shortness of breath.   Cardiovascular: Negative.  Negative for chest pain and leg swelling.  Gastrointestinal: Negative.  Negative for abdominal pain.  Genitourinary: Negative.  Negative for dysuria and hematuria.  Musculoskeletal: Negative.  Negative for back pain.  Skin: Negative.  Negative for rash.  Neurological:  Positive for weakness. Negative for dizziness, focal weakness and headaches.  Psychiatric/Behavioral:  The patient is not nervous/anxious.     As per HPI. Otherwise, a complete review of systems is negative.  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   DCIS (ductal carcinoma in situ)    Osteoarthritis    Personal history of radiation therapy     PAST SURGICAL HISTORY: Past  Surgical History:  Procedure Laterality Date   BREAST BIOPSY Right 2012   +   BREAST LUMPECTOMY Right 2012   BUNIONECTOMY     CERVICAL LAMINECTOMY     ROTATOR CUFF REPAIR      FAMILY HISTORY: Family History  Problem Relation Age of Onset   Breast cancer Mother 58   Coronary artery disease Mother    Hypertension Mother    Stroke Mother    Heart attack Father     ADVANCED DIRECTIVES (Y/N):  N  HEALTH MAINTENANCE: Social History   Tobacco Use   Smoking status: Never   Smokeless tobacco: Never  Substance Use Topics   Alcohol use: Never   Drug use: Never     Colonoscopy:  PAP:  Bone density:  Lipid panel:  Allergies  Allergen Reactions   Quinine Hives    Current Outpatient Medications  Medication Sig Dispense Refill   Acetaminophen (TYLENOL) 325 MG CAPS Take by mouth.     dexamethasone (DECADRON) 2 MG tablet Take 1 tablet (2 mg total) by mouth daily. 30 tablet 1   apixaban (ELIQUIS) 5 MG TABS tablet Take 5 mg by mouth 2 (two) times daily.  (Patient not taking: Reported on 04/28/2023)     B Complex-Folic Acid (SM BALANCED B-100) TABS Take 1 tablet by mouth daily.  (Patient not taking: Reported on 04/28/2023)     Biotin 1000 MCG tablet Take 1,000 mcg by mouth daily.  (Patient not taking: Reported on 04/28/2023)     calcium carbonate (TUMS - DOSED IN MG ELEMENTAL CALCIUM) 500 MG chewable tablet Chew 1 tablet by mouth daily.  (Patient not taking: Reported  on 04/28/2023)     calcium-vitamin D (OSCAL WITH D) 500-5 MG-MCG tablet Take 1 tablet by mouth daily with breakfast. 30 tablet 1   CO ENZYME Q-10 PO Take 1 capsule by mouth daily.  (Patient not taking: Reported on 04/28/2023)     Cyanocobalamin (VITAMIN B 12 PO) Take 1 tablet by mouth daily.  (Patient not taking: Reported on 04/28/2023)     dicyclomine (BENTYL) 20 MG tablet Take by mouth.     diphenhydrAMINE (BENADRYL) 25 mg capsule Take 25 mg by mouth every 6 (six) hours as needed.     hydrOXYzine (ATARAX) 10 MG tablet Take 0.5-1  tablets (5-10 mg total) by mouth 3 (three) times daily as needed for itching. 30 tablet 0   latanoprost (XALATAN) 0.005 % ophthalmic solution Place 1 drop into both eyes at bedtime. (Patient not taking: Reported on 04/28/2023)  5   levothyroxine (SYNTHROID) 25 MCG tablet Take 25 mcg by mouth every morning.     lidocaine-prilocaine (EMLA) cream Apply to affected area once 30 g 3   Multiple Vitamin (MULTI-VITAMINS) TABS Take 1 tablet by mouth daily.  (Patient not taking: Reported on 04/28/2023)     ondansetron (ZOFRAN) 8 MG tablet Take 8 mg by mouth every 8 (eight) hours as needed. (Patient not taking: Reported on 06/02/2023)     ondansetron (ZOFRAN) 8 MG tablet Take 1 tablet (8 mg total) by mouth every 8 (eight) hours as needed for nausea or vomiting. 60 tablet 2   polyethylene glycol (MIRALAX) 17 g packet Take by mouth.     Red Yeast Rice 600 MG TABS Take 2 tablets by mouth daily.  (Patient not taking: Reported on 04/28/2023)     rosuvastatin (CRESTOR) 10 MG tablet Take 10 mg by mouth daily.     senna (SENOKOT) 8.6 MG tablet Take 1 tablet by mouth at bedtime.     SIMBRINZA 1-0.2 % SUSP INSTILL 1 DROP INTO BOTH EYES TWICE A DAY (Patient not taking: Reported on 04/28/2023)  5   traMADol (ULTRAM) 50 MG tablet Take 1 tablet (50 mg total) by mouth every 6 (six) hours as needed. 30 tablet 0   trimethoprim-polymyxin b (POLYTRIM) ophthalmic solution Place 1 drop into both eyes every 4 (four) hours.     vitamin C (ASCORBIC ACID) 500 MG tablet Take 500 mg by mouth daily.  (Patient not taking: Reported on 04/28/2023)     No current facility-administered medications for this visit.   Facility-Administered Medications Ordered in Other Visits  Medication Dose Route Frequency Provider Last Rate Last Admin   sodium chloride flush (NS) 0.9 % injection 10 mL  10 mL Intravenous PRN Jeralyn Ruths, MD   10 mL at 05/26/23 0927    OBJECTIVE: Vitals:   06/02/23 1000  BP: (!) 131/93  Pulse: 70  SpO2: 99%     Body  mass index is 18.31 kg/m.    ECOG FS:1 - Symptomatic but completely ambulatory  General: Thin, no acute distress.  Sitting in a wheelchair. Eyes: Pink conjunctiva, anicteric sclera. HEENT: Normocephalic, moist mucous membranes. Lungs: No audible wheezing or coughing. Heart: Regular rate and rhythm. Abdomen: Soft, nontender, no obvious distention. Musculoskeletal: No edema, cyanosis, or clubbing. Neuro: Alert, answering all questions appropriately. Cranial nerves grossly intact. Skin: No rashes or petechiae noted. Psych: Normal affect.   LAB RESULTS:  Lab Results  Component Value Date   NA 138 06/02/2023   K 4.3 06/02/2023   CL 111 06/02/2023   CO2 19 (L)  06/02/2023   GLUCOSE 114 (H) 06/02/2023   BUN 16 06/02/2023   CREATININE 1.19 (H) 06/02/2023   CALCIUM 7.0 (L) 06/02/2023   PROT 6.5 06/02/2023   ALBUMIN 3.1 (L) 06/02/2023   AST 45 (H) 06/02/2023   ALT 19 06/02/2023   ALKPHOS 121 06/02/2023   BILITOT 0.3 06/02/2023   GFRNONAA 46 (L) 06/02/2023   GFRAA >60 05/12/2018    Lab Results  Component Value Date   WBC 3.8 (L) 06/02/2023   NEUTROABS 2.2 06/02/2023   HGB 10.9 (L) 06/02/2023   HCT 34.9 (L) 06/02/2023   MCV 98.3 06/02/2023   PLT 168 06/02/2023     STUDIES: NM PET Image Initial (PI) Skull Base To Thigh  Result Date: 05/11/2023 CLINICAL DATA:  Subsequent treatment strategy for endometrial/cervical cancer. Status post hysterectomy in 2002. History of right breast cancer, status post lumpectomy in 2009. EXAM: NUCLEAR MEDICINE PET SKULL BASE TO THIGH TECHNIQUE: 6.4 mCi F-18 FDG was injected intravenously. Full-ring PET imaging was performed from the skull base to thigh after the radiotracer. CT data was obtained and used for attenuation correction and anatomic localization. Fasting blood glucose: 101 mg/dl COMPARISON:  Outside hospital [facility]) CT abdomen/pelvis dated 03/23/2023). Outside hospital [facility]) CT chest  dated 09/29/2022. FINDINGS: Mediastinal blood pool activity: SUV max 2.0 Liver activity: SUV max NA NECK: No hypermetabolic cervical lymphadenopathy. Incidental CT findings: None. CHEST: 2.1 x 1.9 cm medial right lower lobe nodule (series 7/image 60), previously 1.6 x 1.5 cm, max SUV 14.6, compatible with pulmonary metastasis. No hypermetabolic thoracic lymphadenopathy. Right chest port terminates in the upper right atrium. Incidental CT findings: Cardiomegaly. Atherosclerotic calcifications of the aortic arch. Moderate three-vessel coronary atherosclerosis. ABDOMEN/PELVIS: Abnormal soft tissue along the left vaginal cuff, measuring approximately 1.7 x 2.5 cm (series 3/image 214), similar to the prior, max SUV 11.3, reflecting local recurrence. Dominant 2.0 cm peritoneal implant along the left mid abdomen (series 3/image 166), previously 1.9 cm, max SUV 10.8. Additional implants along the left lateral/upper abdominal wall with max SUV 11.9 and along the anterior abdominal mesentery with max SUV 11.0. Suspected peritoneal implant versus hepatic metastasis along the falciform ligament, poorly visualized on unenhanced CT but measuring 14 mm on the prior enhanced study, max SUV 7.9. 6 mm short axis pericaval node (series 3/image 141), max SUV 5.5. 9 mm short axis right external iliac node versus pelvic peritoneal metastasis (series 3/image 206), max SUV 8.6. No abnormal hypermetabolism in the spleen, pancreas, or adrenal glands. Incidental CT findings: Benign hepatic cysts. Gallbladder distension. Atherosclerotic calcifications of the abdominal aorta and branch vessels. Trace ascites along the inferior right liver margin. SKELETON: Multifocal osseous metastases, including: --Right posteromedial 5th rib, max SUV 11.9 --Right lateral T6 vertebral body, max SUV 12.4 --Left sacral ala, max SUV 4.6 --Left iliac bone max SUV 4.1 --Right proximal femur, max SUV 4.0 Incidental CT findings: Degenerative changes of the visualized  thoracolumbar spine. IMPRESSION: Local recurrence along the left vaginal cuff. Multifocal peritoneal implants with small retroperitoneal/pelvic nodes, as above. 2.1 cm right lower lobe metastasis, increased. Multifocal osseous metastases, as above. Electronically Signed   By: Charline Bills M.D.   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  She is also undergone multiple rounds of treatment which included carboplatin and Taxol, Doxil, Keytruda/lenvatinib, and more recently  Enhertu.  Patient has also had pelvic XRT.  Patient reports she has been off treatment since approximately March 2024.  PET scan results from May 11, 2023 reviewed independently and reported as above with localized recurrence as well as multifocal peritoneal implants, retroperitoneal lymphadenopathy, and multifocal osseous metastasis. Hospice and end-of-life were previously discussed, but patient declined and wishes to pursue treatment.  Plan is to give single agent dose reduced gemcitabine on days 1 and 8 with day 15 off.  This will be a 21-day cycle.  Patient and her family expressed understanding that there are likely no further treatment options after this.  Proceed with cycle 1, day 8 of treatment today.  Return to clinic in 2 weeks for further evaluation and consideration of cycle 2, day 1.  Appreciate palliative care input.   Bony metastasis: Patient will receive Zometa on day 1 of odd-numbered cycles. Hypocalcemia: I recommended oral calcium supplementation. Vaginal bleeding: Patient does not complain of this today.  Appreciate gyn-onc input.  Patient has discontinued Eliquis.  Continue Venofer as scheduled.   Anemia: Chronic and unchanged.  Venofer as above. History of DCIS: Patient completed tamoxifen in approximately 2019. Renal insufficiency: Patient's creatinine is  trended up slightly to 1.19.  Return to clinic on Friday for 1 L of fluids. Poor appetite: Patient was given a prescription for dexamethasone 2 mg daily.  She will also receive IV steroids along with her fluids on Friday.   Patient expressed understanding and was in agreement with this plan. She also understands that She can call clinic at any time with any questions, concerns, or complaints.    Cancer Staging  Endometrial cancer [facility]) Staging form: Corpus Uteri - Carcinoma and Carcinosarcoma, AJCC 8th Edition - Clinical stage from 04/28/2023: FIGO Stage IVB (cTX, cNX, pM1) - Signed by Jeralyn Ruths, MD on 04/28/2023 Stage prefix: Initial diagnosis   Jeralyn Ruths, MD   06/02/2023 1:37 PM

## 2023-06-02 NOTE — Patient Instructions (Signed)
[facility] [place] [facility]  Discharge Instructions: Thank you for choosing [facility] to provide your oncology and hematology care.  If you have a lab appointment with the Cancer Center, please go directly to the Cancer Center and check in at the registration area.  Wear comfortable clothing and clothing appropriate for easy access to any Portacath or PICC line.   We strive to give you quality time with your provider. You may need to reschedule your appointment if you arrive late (15 or more minutes).  Arriving late affects you and other patients whose appointments are after yours.  Also, if you miss three or more appointments without notifying the office, you may be dismissed from the clinic at the provider's discretion.      For prescription refill requests, have your pharmacy contact our office and allow 72 hours for refills to be completed.     To help prevent nausea and vomiting after your treatment, we encourage you to take your nausea medication as directed.  BELOW ARE SYMPTOMS THAT SHOULD BE REPORTED IMMEDIATELY: *FEVER GREATER THAN 100.4 F (38 C) OR HIGHER *CHILLS OR SWEATING *NAUSEA AND VOMITING THAT IS NOT CONTROLLED WITH YOUR NAUSEA MEDICATION *UNUSUAL SHORTNESS OF BREATH *UNUSUAL BRUISING OR BLEEDING *URINARY PROBLEMS (pain or burning when urinating, or frequent urination) *BOWEL PROBLEMS (unusual diarrhea, constipation, pain near the anus) TENDERNESS IN MOUTH AND THROAT WITH OR WITHOUT PRESENCE OF ULCERS (sore throat, sores in mouth, or a toothache) UNUSUAL RASH, SWELLING OR PAIN  UNUSUAL VAGINAL DISCHARGE OR ITCHING   Items with * indicate a potential emergency and should be followed up as soon as possible or go to the Emergency Department if any problems should occur.  Please show the CHEMOTHERAPY ALERT CARD or IMMUNOTHERAPY ALERT CARD at check-in to the Emergency Department and triage nurse.  Should you have questions after your visit  or need to cancel or reschedule your appointment, please contact [facility] [place] [facility]  [phone] and follow the prompts.  Office hours are 8:00 a.m. to 4:30 p.m. Monday - Friday. Please note that voicemails left after 4:00 p.m. may not be returned until the following business day.  We are closed weekends and major holidays. You have access to a nurse at all times for urgent questions. Please call the main number to the clinic [phone] and follow the prompts.  For any non-urgent questions, you may also contact your provider using MyChart. We now offer e-Visits for anyone 18 and older to request care online for non-urgent symptoms. For details visit [email or web address].   Also download the MyChart app! Go to the app store, search "MyChart", open the app, select , and log in with your MyChart username and password.    

## 2023-06-02 NOTE — Progress Notes (Signed)
Patient is extremely weak with little to no energy.

## 2023-06-02 NOTE — Progress Notes (Signed)
Palliative Medicine [facility] at [facility] Telephone:(336) [phone] Fax:(336) [phone]   Name: Olivia Burton Date: 06/02/2023 MRN: [id number]  DOB: 16-Oct-1941  Patient Care Team: Kandyce Rud, MD as PCP - General (Family Medicine) Benita Gutter, RN as Oncology Nurse Navigator Orlie Dakin, Tollie Pizza, MD as Consulting Physician (Oncology)    REASON FOR CONSULTATION: Olivia Burton is a 82 y.o. 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  Patient is status post multiple previous lines of treatment.  Treatment was ultimately held due to declining performance status.  She was referred to palliative care to address goals.  SOCIAL HISTORY:     reports that she has never smoked. She has never used smokeless tobacco. She reports that she does not drink alcohol and does not use drugs.  Patient is married lives at home with her husband who is her primary caregiver.  She also has a son who is involved.  ADVANCE DIRECTIVES:    CODE STATUS:   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   DCIS (ductal carcinoma in situ)    Osteoarthritis    Personal history of radiation therapy     PAST SURGICAL HISTORY:  Past Surgical History:  Procedure Laterality Date   BREAST BIOPSY Right 2012   +   BREAST LUMPECTOMY Right 2012   BUNIONECTOMY     CERVICAL LAMINECTOMY     ROTATOR CUFF REPAIR      HEMATOLOGY/ONCOLOGY HISTORY:  Oncology History  Endometrial cancer (HCC)  04/28/2023 Initial Diagnosis   Endometrial cancer (HCC)   04/28/2023 Cancer Staging   Staging form: Corpus Uteri - Carcinoma and Carcinosarcoma, AJCC 8th Edition - Clinical stage from 04/28/2023: FIGO Stage IVB (cTX, cNX, pM1) - Signed by Jeralyn Ruths, MD on 04/28/2023 Stage prefix: Initial diagnosis   05/26/2023 -  Chemotherapy   Patient  is on Treatment Plan : Uterine Gemcitabine D1,8 (800) q21d x 6 Cycles       ALLERGIES:  is allergic to quinine.  MEDICATIONS:  Current Outpatient Medications  Medication Sig Dispense Refill   calcium-vitamin D (OSCAL WITH D) 500-5 MG-MCG tablet Take 1 tablet by mouth daily with breakfast. 30 tablet 1   hydrOXYzine (ATARAX) 10 MG tablet Take 0.5-1 tablets (5-10 mg total) by mouth 3 (three) times daily as needed for itching. 30 tablet 0   Acetaminophen (TYLENOL) 325 MG CAPS Take by mouth.     apixaban (ELIQUIS) 5 MG TABS tablet Take 5 mg by mouth 2 (two) times daily.  (Patient not taking: Reported on 04/28/2023)     B Complex-Folic Acid (SM BALANCED B-100) TABS Take 1 tablet by mouth daily.  (Patient not taking: Reported on 04/28/2023)     Biotin 1000 MCG tablet Take 1,000 mcg by mouth daily.  (Patient not taking: Reported on 04/28/2023)     calcium carbonate (TUMS - DOSED IN MG ELEMENTAL CALCIUM) 500 MG chewable tablet Chew 1 tablet by mouth daily.  (Patient not taking: Reported on 04/28/2023)     CO ENZYME Q-10 PO Take 1 capsule by mouth daily.  (Patient not taking: Reported on 04/28/2023)     Cyanocobalamin (VITAMIN B 12 PO) Take 1 tablet by mouth daily.  (Patient not taking: Reported on 04/28/2023)     dexamethasone (DECADRON) 2 MG tablet Take 1 tablet (2 mg total) by mouth daily.  30 tablet 1   dicyclomine (BENTYL) 20 MG tablet Take by mouth.     diphenhydrAMINE (BENADRYL) 25 mg capsule Take 25 mg by mouth every 6 (six) hours as needed.     latanoprost (XALATAN) 0.005 % ophthalmic solution Place 1 drop into both eyes at bedtime. (Patient not taking: Reported on 04/28/2023)  5   levothyroxine (SYNTHROID) 25 MCG tablet Take 25 mcg by mouth every morning.     lidocaine-prilocaine (EMLA) cream Apply to affected area once 30 g 3   Multiple Vitamin (MULTI-VITAMINS) TABS Take 1 tablet by mouth daily.  (Patient not taking: Reported on 04/28/2023)     ondansetron (ZOFRAN) 8 MG tablet Take 8 mg by mouth every 8  (eight) hours as needed. (Patient not taking: Reported on 06/02/2023)     ondansetron (ZOFRAN) 8 MG tablet Take 1 tablet (8 mg total) by mouth every 8 (eight) hours as needed for nausea or vomiting. 60 tablet 2   polyethylene glycol (MIRALAX) 17 g packet Take by mouth.     Red Yeast Rice 600 MG TABS Take 2 tablets by mouth daily.  (Patient not taking: Reported on 04/28/2023)     rosuvastatin (CRESTOR) 10 MG tablet Take 10 mg by mouth daily.     senna (SENOKOT) 8.6 MG tablet Take 1 tablet by mouth at bedtime.     SIMBRINZA 1-0.2 % SUSP INSTILL 1 DROP INTO BOTH EYES TWICE A DAY (Patient not taking: Reported on 04/28/2023)  5   traMADol (ULTRAM) 50 MG tablet Take 1 tablet (50 mg total) by mouth every 6 (six) hours as needed. 30 tablet 0   trimethoprim-polymyxin b (POLYTRIM) ophthalmic solution Place 1 drop into both eyes every 4 (four) hours.     vitamin C (ASCORBIC ACID) 500 MG tablet Take 500 mg by mouth daily.  (Patient not taking: Reported on 04/28/2023)     No current facility-administered medications for this visit.   Facility-Administered Medications Ordered in Other Visits  Medication Dose Route Frequency Provider Last Rate Last Admin   heparin lock flush 100 unit/mL  500 Units Intracatheter Once PRN Jeralyn Ruths, MD       sodium chloride flush (NS) 0.9 % injection 10 mL  10 mL Intravenous PRN Jeralyn Ruths, MD   10 mL at 05/26/23 1610    VITAL SIGNS: There were no vitals taken for this visit. There were no vitals filed for this visit.  Estimated body mass index is 18.31 kg/m as calculated from the following:   Height as of 05/26/23: 5\' 9"  (1.753 m).   Weight as of an earlier encounter on 06/02/23: 124 lb (56.2 kg).  LABS: CBC:    Component Value Date/Time   WBC 3.8 (L) 06/02/2023 0923   WBC 3.9 05/12/2018 1857   HGB 10.9 (L) 06/02/2023 0923   HGB 12.9 01/03/2013 1105   HCT 34.9 (L) 06/02/2023 0923   HCT 38.4 01/03/2013 1105   PLT 168 06/02/2023 0923   PLT 179 01/03/2013  1105   MCV 98.3 06/02/2023 0923   MCV 88 01/03/2013 1105   NEUTROABS 2.2 06/02/2023 0923   NEUTROABS 1.2 (L) 01/03/2013 1105   LYMPHSABS 1.2 06/02/2023 0923   LYMPHSABS 1.2 01/03/2013 1105   MONOABS 0.4 06/02/2023 0923   MONOABS 0.4 01/03/2013 1105   EOSABS 0.0 06/02/2023 0923   EOSABS 0.1 01/03/2013 1105   BASOSABS 0.0 06/02/2023 0923   BASOSABS 0.0 01/03/2013 1105   Comprehensive Metabolic Panel:    Component Value Date/Time  NA 138 06/02/2023 0923   K 4.3 06/02/2023 0923   CL 111 06/02/2023 0923   CO2 19 (L) 06/02/2023 0923   BUN 16 06/02/2023 0923   CREATININE 1.19 (H) 06/02/2023 0923   GLUCOSE 114 (H) 06/02/2023 0923   CALCIUM 7.0 (L) 06/02/2023 0923   AST 45 (H) 06/02/2023 0923   ALT 19 06/02/2023 0923   ALKPHOS 121 06/02/2023 0923   BILITOT 0.3 06/02/2023 0923   PROT 6.5 06/02/2023 0923   ALBUMIN 3.1 (L) 06/02/2023 0923    RADIOGRAPHIC STUDIES: NM PET Image Initial (PI) Skull Base To Thigh  Result Date: 05/11/2023 CLINICAL DATA:  Subsequent treatment strategy for endometrial/cervical cancer. Status post hysterectomy in 2002. History of right breast cancer, status post lumpectomy in 2009. EXAM: NUCLEAR MEDICINE PET SKULL BASE TO THIGH TECHNIQUE: 6.4 mCi F-18 FDG was injected intravenously. Full-ring PET imaging was performed from the skull base to thigh after the radiotracer. CT data was obtained and used for attenuation correction and anatomic localization. Fasting blood glucose: 101 mg/dl COMPARISON:  Outside hospital [facility]) CT abdomen/pelvis dated 03/23/2023). Outside hospital [facility]) CT chest dated 09/29/2022. FINDINGS: Mediastinal blood pool activity: SUV max 2.0 Liver activity: SUV max NA NECK: No hypermetabolic cervical lymphadenopathy. Incidental CT findings: None. CHEST: 2.1 x 1.9 cm medial right lower lobe nodule (series 7/image 60), previously 1.6 x 1.5 cm, max SUV 14.6, compatible with pulmonary metastasis.  No hypermetabolic thoracic lymphadenopathy. Right chest port terminates in the upper right atrium. Incidental CT findings: Cardiomegaly. Atherosclerotic calcifications of the aortic arch. Moderate three-vessel coronary atherosclerosis. ABDOMEN/PELVIS: Abnormal soft tissue along the left vaginal cuff, measuring approximately 1.7 x 2.5 cm (series 3/image 214), similar to the prior, max SUV 11.3, reflecting local recurrence. Dominant 2.0 cm peritoneal implant along the left mid abdomen (series 3/image 166), previously 1.9 cm, max SUV 10.8. Additional implants along the left lateral/upper abdominal wall with max SUV 11.9 and along the anterior abdominal mesentery with max SUV 11.0. Suspected peritoneal implant versus hepatic metastasis along the falciform ligament, poorly visualized on unenhanced CT but measuring 14 mm on the prior enhanced study, max SUV 7.9. 6 mm short axis pericaval node (series 3/image 141), max SUV 5.5. 9 mm short axis right external iliac node versus pelvic peritoneal metastasis (series 3/image 206), max SUV 8.6. No abnormal hypermetabolism in the spleen, pancreas, or adrenal glands. Incidental CT findings: Benign hepatic cysts. Gallbladder distension. Atherosclerotic calcifications of the abdominal aorta and branch vessels. Trace ascites along the inferior right liver margin. SKELETON: Multifocal osseous metastases, including: --Right posteromedial 5th rib, max SUV 11.9 --Right lateral T6 vertebral body, max SUV 12.4 --Left sacral ala, max SUV 4.6 --Left iliac bone max SUV 4.1 --Right proximal femur, max SUV 4.0 Incidental CT findings: Degenerative changes of the visualized thoracolumbar spine. IMPRESSION: Local recurrence along the left vaginal cuff. Multifocal peritoneal implants with small retroperitoneal/pelvic nodes, as above. 2.1 cm right lower lobe metastasis, increased. Multifocal osseous metastases, as above. Electronically Signed   By: Charline Bills M.D.   On: 05/11/2023 00:05     PERFORMANCE STATUS (ECOG) : 2 - Symptomatic, <50% confined to bed  Review of Systems Unless otherwise noted, a complete review of systems is negative.  Physical Exam General: NAD Pulmonary: Unlabored Extremities: no edema, no joint deformities Skin: no rashes Neurological: Weakness but otherwise nonfocal  IMPRESSION: PET scan 05/06/2023 revealed local recurrence along the left vaginal cuff, multifocal peritoneal implants, increased right lower lobe metastasis, and multifocal osseous  metastases.  Patient seen in infusion.  She is accompanied by her husband.  Patient receiving iron today.  Husband reports that pain is improved with acetaminophen/ibuprofen and occasional tramadol.  Patient is occasionally having abdominal fullness, likely secondary to mass effect.  No nausea or vomiting reported.  Oral intake is poor.  Husband also says the patient has pruritus following gemcitabine.  No noted rashes.  He would like something prescribed for occasional itching.  Will send Rx for hydroxyzine.  Hypocalcemia noted.  Discussed with Dr. Orlie Dakin and will start patient on Os-Cal with vitamin D.  Recheck labs at time of next appointment.  PLAN: -Continue current scope of treatment -Continue tramadol as needed for pain -Will benefit from ACP/MOST Form -Follow-up telephone visit 3 to 4 weeks  Case and plan discussed with Dr. Orlie Dakin   Patient expressed understanding and was in agreement with this plan. She also understands that She can call the clinic at any time with any questions, concerns, or complaints.     Time Total: 15 minutes  Visit consisted of counseling and education dealing with the complex and emotionally intense issues of symptom management and palliative care in the setting of serious and potentially life-threatening illness.Greater than 50%  of this time was spent counseling and coordinating care related to the above assessment and plan.  Signed by: Laurette Schimke,  PhD, NP-C

## 2023-06-02 NOTE — Consult Note (Signed)
NEW PATIENT EVALUATION  Name: Olivia Burton  MRN: [id number]  Date:   06/02/2023     DOB: Feb 16, 1941   This 82 y.o. female patient presents to the clinic for initial evaluation of palliative radiation therapy to her vaginal apex and patient with known stage IV endometrial carcinoma with vaginal bleeding.  REFERRING PHYSICIAN: Kandyce Rud, MD  CHIEF COMPLAINT:  Chief Complaint  Patient presents with   Consult    DIAGNOSIS: The encounter diagnosis was Endometrial cancer (HCC).   PREVIOUS INVESTIGATIONS:  PET/CT CT scans reviewed Pathology report reviewed Clinical notes reviewed  HPI: Patient is an 82 year old female former patient of mine treated back over 15 years prior for right breast cancer.  She presented with postmenopausal bleeding back in November 21 biopsy revealed endometrial adenocarcinoma.  She had TLH LSO with sentinel lymph node mapping.  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  She had undergone carboplatinum and Taxol as well as pelvic radiation therapy at institutions outside of [facility].  She now has documented extensive osseous metastatic disease and had been started on Pembro.  She has had radiation therapy to her sacrum for metastatic disease as well as a vaginal cuff.  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  Recent PET CT scan shows local recurrence along the left vaginal cuff and multifocal peritoneal implants with retroperitoneal pelvic nodes.  She is been discussed for possible further palliative radiation therapy to try to control the vaginal bleeding.  PLANNED TREATMENT REGIMEN: IMRT radiation therapy to vaginal cuff and area of PET positivity  PAST MEDICAL HISTORY:  has a past medical history of Breast cancer (HCC) (2012), DCIS (ductal  carcinoma in situ), Osteoarthritis, and Personal history of radiation therapy.    PAST SURGICAL HISTORY:  Past Surgical History:  Procedure Laterality Date   BREAST BIOPSY Right 2012   +   BREAST LUMPECTOMY Right 2012   BUNIONECTOMY     CERVICAL LAMINECTOMY     ROTATOR CUFF REPAIR      FAMILY HISTORY: family history includes Breast cancer (age of onset: 51) in her mother; Coronary artery disease in her mother; Heart attack in her father; Hypertension in her mother; Stroke in her mother.  SOCIAL HISTORY:  reports that she has never smoked. She has never used smokeless tobacco. She reports that she does not drink alcohol and does not use drugs.  ALLERGIES: Quinine  MEDICATIONS:  Current Outpatient Medications  Medication Sig Dispense Refill   Acetaminophen (TYLENOL) 325 MG CAPS Take by mouth.     apixaban (ELIQUIS) 5 MG TABS tablet Take 5 mg by mouth 2 (two) times daily.  (Patient not taking: Reported on 04/28/2023)     B Complex-Folic Acid (SM BALANCED B-100) TABS Take 1 tablet by mouth daily.  (Patient not taking: Reported on 04/28/2023)     Biotin 1000 MCG tablet Take 1,000 mcg by mouth daily.  (Patient not taking: Reported on 04/28/2023)     calcium carbonate (TUMS - DOSED IN MG ELEMENTAL CALCIUM) 500 MG chewable tablet Chew 1 tablet by mouth daily.  (Patient not taking: Reported on 04/28/2023)     calcium-vitamin D (OSCAL WITH D) 500-5 MG-MCG tablet Take 1 tablet by mouth daily with breakfast. 30 tablet 1   CO ENZYME Q-10 PO Take 1 capsule by mouth daily.  (Patient not taking:  Reported on 04/28/2023)     Cyanocobalamin (VITAMIN B 12 PO) Take 1 tablet by mouth daily.  (Patient not taking: Reported on 04/28/2023)     dexamethasone (DECADRON) 2 MG tablet Take 1 tablet (2 mg total) by mouth daily. 30 tablet 1   dicyclomine (BENTYL) 20 MG tablet Take by mouth.     diphenhydrAMINE (BENADRYL) 25 mg capsule Take 25 mg by mouth every 6 (six) hours as needed.     hydrOXYzine (ATARAX) 10 MG tablet Take  0.5-1 tablets (5-10 mg total) by mouth 3 (three) times daily as needed for itching. 30 tablet 0   latanoprost (XALATAN) 0.005 % ophthalmic solution Place 1 drop into both eyes at bedtime. (Patient not taking: Reported on 04/28/2023)  5   levothyroxine (SYNTHROID) 25 MCG tablet Take 25 mcg by mouth every morning.     lidocaine-prilocaine (EMLA) cream Apply to affected area once 30 g 3   Multiple Vitamin (MULTI-VITAMINS) TABS Take 1 tablet by mouth daily.  (Patient not taking: Reported on 04/28/2023)     ondansetron (ZOFRAN) 8 MG tablet Take 8 mg by mouth every 8 (eight) hours as needed. (Patient not taking: Reported on 06/02/2023)     ondansetron (ZOFRAN) 8 MG tablet Take 1 tablet (8 mg total) by mouth every 8 (eight) hours as needed for nausea or vomiting. 60 tablet 2   polyethylene glycol (MIRALAX) 17 g packet Take by mouth.     Red Yeast Rice 600 MG TABS Take 2 tablets by mouth daily.  (Patient not taking: Reported on 04/28/2023)     rosuvastatin (CRESTOR) 10 MG tablet Take 10 mg by mouth daily.     senna (SENOKOT) 8.6 MG tablet Take 1 tablet by mouth at bedtime.     SIMBRINZA 1-0.2 % SUSP INSTILL 1 DROP INTO BOTH EYES TWICE A DAY (Patient not taking: Reported on 04/28/2023)  5   traMADol (ULTRAM) 50 MG tablet Take 1 tablet (50 mg total) by mouth every 6 (six) hours as needed. 30 tablet 0   trimethoprim-polymyxin b (POLYTRIM) ophthalmic solution Place 1 drop into both eyes every 4 (four) hours.     vitamin C (ASCORBIC ACID) 500 MG tablet Take 500 mg by mouth daily.  (Patient not taking: Reported on 04/28/2023)     No current facility-administered medications for this encounter.   Facility-Administered Medications Ordered in Other Encounters  Medication Dose Route Frequency Provider Last Rate Last Admin   sodium chloride flush (NS) 0.9 % injection 10 mL  10 mL Intravenous PRN Jeralyn Ruths, MD   10 mL at 05/26/23 0927    ECOG PERFORMANCE STATUS:  2 - Symptomatic, <50% confined to bed  REVIEW OF  SYSTEMS: Patient denies any weight loss, fatigue, weakness, fever, chills or night sweats. Patient denies any loss of vision, blurred vision. Patient denies any ringing  of the ears or hearing loss. No irregular heartbeat. Patient denies heart murmur or history of fainting. Patient denies any chest pain or pain radiating to her upper extremities. Patient denies any shortness of breath, difficulty breathing at night, cough or hemoptysis. Patient denies any swelling in the lower legs. Patient denies any nausea vomiting, vomiting of blood, or coffee ground material in the vomitus. Patient denies any stomach pain. Patient states has had normal bowel movements no significant constipation or diarrhea. Patient denies any dysuria, hematuria or significant nocturia. Patient denies any problems walking, swelling in the joints or loss of balance. Patient denies any skin changes, loss of hair or  loss of weight. Patient denies any excessive worrying or anxiety or significant depression. Patient denies any problems with insomnia. Patient denies excessive thirst, polyuria, polydipsia. Patient denies any swollen glands, patient denies easy bruising or easy bleeding. Patient denies any recent infections, allergies or URI. Patient "s visual fields have not changed significantly in recent time.   PHYSICAL EXAM: There were no vitals taken for this visit. Wheelchair-bound female frail in NAD.  Well-developed well-nourished patient in NAD. HEENT reveals PERLA, EOMI, discs not visualized.  Oral cavity is clear. No oral mucosal lesions are identified. Neck is clear without evidence of cervical or supraclavicular adenopathy. Lungs are clear to A&P. Cardiac examination is essentially unremarkable with regular rate and rhythm without murmur rub or thrill. Abdomen is benign with no organomegaly or masses noted. Motor sensory and DTR levels are equal and symmetric in the upper and lower extremities. Cranial nerves II through XII are  grossly intact. Proprioception is intact. No peripheral adenopathy or edema is identified. No motor or sensory levels are noted. Crude visual fields are within normal range.  LABORATORY DATA: Pathology reports and labs reviewed    RADIOLOGY RESULTS: PET CT scan reviewed   IMPRESSION: Vaginal apex recurrence of endometrial carcinoma and patient undergoing multiple therapies and multiple systemic treatments for stage IV disease now with vaginal bleeding and 82 year old female  PLAN: At this time like to go ahead with IMRT radiation therapy to her vaginal apex.  Will plan on delivering 30 Gray in 15 fractions.  I would choose IMRT to spare critical structures such as her bladder small bowel large bowel sacral plexus previously radiated areas.  Risks and benefits of treatment including possible diarrhea possible increased lower urinary tract symptoms fatigue alteration blood counts and the risks of treating previously radiated tissue all were discussed with the patient.  I have personally 7 ordered CT simulation for early next week.  I would like to take this opportunity to thank you for allowing me to participate in the care of your patient.Carmina Miller, MD

## 2023-06-03 MED FILL — Iron Sucrose Inj 20 MG/ML (Fe Equiv): INTRAVENOUS | Qty: 10 | Status: AC

## 2023-06-04 ENCOUNTER — Inpatient Hospital Stay: Payer: Medicare Other

## 2023-06-04 VITALS — BP 139/92 | HR 64 | Resp 20

## 2023-06-04 DIAGNOSIS — Z5111 Encounter for antineoplastic chemotherapy: Secondary | ICD-10-CM | POA: Diagnosis not present

## 2023-06-04 DIAGNOSIS — C541 Malignant neoplasm of endometrium: Secondary | ICD-10-CM

## 2023-06-04 MED ORDER — SODIUM CHLORIDE 0.9% FLUSH
10.0000 mL | Freq: Once | INTRAVENOUS | Status: AC | PRN
  Administered 2023-06-04: 10 mL
  Filled 2023-06-04: qty 10

## 2023-06-04 MED ORDER — SODIUM CHLORIDE 0.9 % IV SOLN
Freq: Once | INTRAVENOUS | Status: AC
  Filled 2023-06-04: qty 250

## 2023-06-04 MED ORDER — HEPARIN SOD (PORK) LOCK FLUSH 100 UNIT/ML IV SOLN
500.0000 [IU] | Freq: Once | INTRAVENOUS | Status: AC | PRN
  Administered 2023-06-04: 500 [IU]
  Filled 2023-06-04: qty 5

## 2023-06-08 ENCOUNTER — Ambulatory Visit
Admission: RE | Admit: 2023-06-08 | Discharge: 2023-06-08 | Disposition: A | Discharge status: Home / Self Care | Payer: Medicare Other | Source: Ambulatory Visit | Attending: Radiation Oncology | Admitting: Radiation Oncology

## 2023-06-08 DIAGNOSIS — C541 Malignant neoplasm of endometrium: Secondary | ICD-10-CM | POA: Insufficient documentation

## 2023-06-08 DIAGNOSIS — Z510 Encounter for antineoplastic radiation therapy: Secondary | ICD-10-CM | POA: Insufficient documentation

## 2023-06-08 DIAGNOSIS — Z5111 Encounter for antineoplastic chemotherapy: Secondary | ICD-10-CM | POA: Diagnosis not present

## 2023-06-10 DIAGNOSIS — Z5111 Encounter for antineoplastic chemotherapy: Secondary | ICD-10-CM | POA: Diagnosis not present

## 2023-06-10 DIAGNOSIS — Z510 Encounter for antineoplastic radiation therapy: Secondary | ICD-10-CM | POA: Diagnosis not present

## 2023-06-11 ENCOUNTER — Telehealth: Payer: Self-pay | Admitting: *Deleted

## 2023-06-11 NOTE — Telephone Encounter (Signed)
Husband called reporting that patient is on a medicine to prevent infection under her toenail call Silver Sulfatadizine

## 2023-06-14 ENCOUNTER — Ambulatory Visit: Admission: RE | Admit: 2023-06-14 | Payer: Medicare Other | Source: Ambulatory Visit

## 2023-06-15 ENCOUNTER — Ambulatory Visit: Admission: RE | Admit: 2023-06-15 | Payer: Medicare Other | Source: Ambulatory Visit

## 2023-06-15 ENCOUNTER — Ambulatory Visit: Payer: Medicare Other

## 2023-06-15 ENCOUNTER — Inpatient Hospital Stay: Payer: Medicare Other

## 2023-06-15 ENCOUNTER — Inpatient Hospital Stay: Payer: Medicare Other | Admitting: Nurse Practitioner

## 2023-06-16 ENCOUNTER — Ambulatory Visit: Payer: Medicare Other

## 2023-06-16 ENCOUNTER — Inpatient Hospital Stay: Payer: Medicare Other

## 2023-06-16 ENCOUNTER — Ambulatory Visit
Admission: RE | Admit: 2023-06-16 | Discharge: 2023-06-16 | Disposition: A | Discharge status: Home / Self Care | Payer: Medicare Other | Source: Ambulatory Visit | Attending: Radiation Oncology | Admitting: Radiation Oncology

## 2023-06-16 ENCOUNTER — Encounter: Payer: Self-pay | Admitting: Medical Oncology

## 2023-06-16 ENCOUNTER — Inpatient Hospital Stay (HOSPITAL_BASED_OUTPATIENT_CLINIC_OR_DEPARTMENT_OTHER): Payer: Medicare Other | Admitting: Medical Oncology

## 2023-06-16 ENCOUNTER — Other Ambulatory Visit: Payer: Medicare Other

## 2023-06-16 ENCOUNTER — Other Ambulatory Visit: Payer: Self-pay

## 2023-06-16 ENCOUNTER — Ambulatory Visit: Payer: Medicare Other | Admitting: Medical Oncology

## 2023-06-16 VITALS — BP 104/78 | HR 72 | Temp 97.0°F | Resp 16 | Ht 69.0 in | Wt 124.0 lb

## 2023-06-16 VITALS — BP 162/95 | HR 61 | Temp 96.1°F | Resp 18

## 2023-06-16 DIAGNOSIS — C541 Malignant neoplasm of endometrium: Secondary | ICD-10-CM

## 2023-06-16 DIAGNOSIS — D649 Anemia, unspecified: Secondary | ICD-10-CM | POA: Diagnosis not present

## 2023-06-16 DIAGNOSIS — R630 Anorexia: Secondary | ICD-10-CM

## 2023-06-16 DIAGNOSIS — Z510 Encounter for antineoplastic radiation therapy: Secondary | ICD-10-CM | POA: Diagnosis not present

## 2023-06-16 DIAGNOSIS — C7951 Secondary malignant neoplasm of bone: Secondary | ICD-10-CM

## 2023-06-16 DIAGNOSIS — R7989 Other specified abnormal findings of blood chemistry: Secondary | ICD-10-CM

## 2023-06-16 DIAGNOSIS — Z7901 Long term (current) use of anticoagulants: Secondary | ICD-10-CM | POA: Insufficient documentation

## 2023-06-16 DIAGNOSIS — Z5111 Encounter for antineoplastic chemotherapy: Secondary | ICD-10-CM | POA: Diagnosis not present

## 2023-06-16 DIAGNOSIS — N289 Disorder of kidney and ureter, unspecified: Secondary | ICD-10-CM

## 2023-06-16 DIAGNOSIS — C55 Malignant neoplasm of uterus, part unspecified: Secondary | ICD-10-CM | POA: Insufficient documentation

## 2023-06-16 DIAGNOSIS — Z978 Presence of other specified devices: Secondary | ICD-10-CM | POA: Insufficient documentation

## 2023-06-16 LAB — CBC WITH DIFFERENTIAL (CANCER CENTER ONLY)
Abs Immature Granulocytes: 0.03 10*3/uL (ref 0.00–0.07)
Basophils Absolute: 0 10*3/uL (ref 0.0–0.1)
Basophils Relative: 0 %
Eosinophils Absolute: 0 10*3/uL (ref 0.0–0.5)
Eosinophils Relative: 0 %
HCT: 31.5 % — ABNORMAL LOW (ref 36.0–46.0)
Hemoglobin: 10.2 g/dL — ABNORMAL LOW (ref 12.0–15.0)
Immature Granulocytes: 1 %
Lymphocytes Relative: 10 %
Lymphs Abs: 0.7 10*3/uL (ref 0.7–4.0)
MCH: 31.4 pg (ref 26.0–34.0)
MCHC: 32.4 g/dL (ref 30.0–36.0)
MCV: 96.9 fL (ref 80.0–100.0)
Monocytes Absolute: 0.8 10*3/uL (ref 0.1–1.0)
Monocytes Relative: 12 %
Neutro Abs: 5.1 10*3/uL (ref 1.7–7.7)
Neutrophils Relative %: 77 %
Platelet Count: 313 10*3/uL (ref 150–400)
RBC: 3.25 MIL/uL — ABNORMAL LOW (ref 3.87–5.11)
RDW: 15.8 % — ABNORMAL HIGH (ref 11.5–15.5)
WBC Count: 6.5 10*3/uL (ref 4.0–10.5)
nRBC: 0 % (ref 0.0–0.2)

## 2023-06-16 LAB — RAD ONC ARIA SESSION SUMMARY
Course Elapsed Days: 0
Plan Fractions Treated to Date: 1
Plan Prescribed Dose Per Fraction: 2 Gy
Plan Total Fractions Prescribed: 15
Plan Total Prescribed Dose: 30 Gy
Reference Point Dosage Given to Date: 2 Gy
Reference Point Session Dosage Given: 2 Gy
Session Number: 1

## 2023-06-16 LAB — CMP (CANCER CENTER ONLY)
ALT: 46 U/L — ABNORMAL HIGH (ref 0–44)
AST: 35 U/L (ref 15–41)
Albumin: 3.1 g/dL — ABNORMAL LOW (ref 3.5–5.0)
Alkaline Phosphatase: 123 U/L (ref 38–126)
Anion gap: 6 (ref 5–15)
BUN: 35 mg/dL — ABNORMAL HIGH (ref 8–23)
CO2: 25 mmol/L (ref 22–32)
Calcium: 8.7 mg/dL — ABNORMAL LOW (ref 8.9–10.3)
Chloride: 107 mmol/L (ref 98–111)
Creatinine: 0.77 mg/dL (ref 0.44–1.00)
GFR, Estimated: >60 mL/min (ref >60)
Glucose, Bld: 139 mg/dL — ABNORMAL HIGH (ref 70–99)
Potassium: 4 mmol/L (ref 3.5–5.1)
Sodium: 138 mmol/L (ref 135–145)
Total Bilirubin: 0.2 mg/dL — ABNORMAL LOW (ref 0.3–1.2)
Total Protein: 6.5 g/dL (ref 6.5–8.1)

## 2023-06-16 MED ORDER — SODIUM CHLORIDE 0.9 % IV SOLN
800.0000 mg/m2 | Freq: Once | INTRAVENOUS | Status: AC
  Administered 2023-06-16: 1330 mg via INTRAVENOUS
  Filled 2023-06-16: qty 34.98

## 2023-06-16 MED ORDER — SODIUM CHLORIDE 0.9 % IV SOLN
Freq: Once | INTRAVENOUS | Status: AC
  Filled 2023-06-16: qty 250

## 2023-06-16 MED ORDER — SODIUM CHLORIDE 0.9 % IV SOLN
Freq: Once | INTRAVENOUS | Status: DC
  Filled 2023-06-16: qty 250

## 2023-06-16 MED ORDER — HEPARIN SOD (PORK) LOCK FLUSH 100 UNIT/ML IV SOLN
500.0000 [IU] | Freq: Once | INTRAVENOUS | Status: AC | PRN
  Administered 2023-06-16: 500 [IU]
  Filled 2023-06-16: qty 5

## 2023-06-16 MED ORDER — PROCHLORPERAZINE MALEATE 10 MG PO TABS
10.0000 mg | ORAL_TABLET | Freq: Once | ORAL | Status: AC
  Administered 2023-06-16: 10 mg via ORAL
  Filled 2023-06-16: qty 1

## 2023-06-16 NOTE — Patient Instructions (Signed)
[facility]  Discharge Instructions: Thank you for choosing [facility] to provide your oncology and hematology care.  If you have a lab appointment with the Cancer Center, please go directly to the Cancer Center and check in at the registration area.  Wear comfortable clothing and clothing appropriate for easy access to any Portacath or PICC line.   We strive to give you quality time with your provider. You may need to reschedule your appointment if you arrive late (15 or more minutes).  Arriving late affects you and other patients whose appointments are after yours.  Also, if you miss three or more appointments without notifying the office, you may be dismissed from the clinic at the provider's discretion.      For prescription refill requests, have your pharmacy contact our office and allow 72 hours for refills to be completed.    Today you received the following chemotherapy and/or immunotherapy agents Gemzar       To help prevent nausea and vomiting after your treatment, we encourage you to take your nausea medication as directed.  BELOW ARE SYMPTOMS THAT SHOULD BE REPORTED IMMEDIATELY: *FEVER GREATER THAN 100.4 F (38 C) OR HIGHER *CHILLS OR SWEATING *NAUSEA AND VOMITING THAT IS NOT CONTROLLED WITH YOUR NAUSEA MEDICATION *UNUSUAL SHORTNESS OF BREATH *UNUSUAL BRUISING OR BLEEDING *URINARY PROBLEMS (pain or burning when urinating, or frequent urination) *BOWEL PROBLEMS (unusual diarrhea, constipation, pain near the anus) TENDERNESS IN MOUTH AND THROAT WITH OR WITHOUT PRESENCE OF ULCERS (sore throat, sores in mouth, or a toothache) UNUSUAL RASH, SWELLING OR PAIN  UNUSUAL VAGINAL DISCHARGE OR ITCHING   Items with * indicate a potential emergency and should be followed up as soon as possible or go to the Emergency Department if any problems should occur.  Please show the CHEMOTHERAPY ALERT CARD or IMMUNOTHERAPY ALERT CARD at check-in to  the Emergency Department and triage nurse.  Should you have questions after your visit or need to cancel or reschedule your appointment, please contact [facility]  [phone] and follow the prompts.  Office hours are 8:00 a.m. to 4:30 p.m. Monday - Friday. Please note that voicemails left after 4:00 p.m. may not be returned until the following business day.  We are closed weekends and major holidays. You have access to a nurse at all times for urgent questions. Please call the main number to the clinic [phone] and follow the prompts.  For any non-urgent questions, you may also contact your provider using MyChart. We now offer e-Visits for anyone 18 and older to request care online for non-urgent symptoms. For details visit [email or web address].   Also download the MyChart app! Go to the app store, search "MyChart", open the app, select [facility], and log in with your MyChart username and password.    

## 2023-06-16 NOTE — Progress Notes (Signed)
Pt  bp 187/70 after ambulation following completion of gemzar. After resting a few minutes, bp 162/95 on recheck. Pt asymptomatic of hypertension. [place], [place] notified. No new orders, pt stable at discharge and educated to notify us with any questions or changes.

## 2023-06-16 NOTE — Progress Notes (Signed)
[facility]  Telephone:(336) [phone] Fax:(336) [phone]  ID: Olivia Burton OB: 1941-10-06  MR#: [id number]  UXL#:[id number]  Patient Care Team: Kandyce Rud, MD as PCP - General (Family Medicine) Benita Gutter, RN as Oncology Nurse Navigator Orlie Dakin, Tollie Pizza, MD as Consulting Physician (Oncology)  CHIEF COMPLAINT: Progressive stage IV uterine cancer  INTERVAL HISTORY: Patient returns to clinic today for further evaluation and consideration of cycle 2 of Gemcitabine. She is here with her husband. Her first cycle went fairly well with the exception of some fatigue and weakness. She is now feeling back to baseline. She is hopeful to get cycle 2 of treatment today.    She denies any pain.  She has no neurologic complaints.  She denies any recent fevers. She has no chest pain, shortness of breath, cough, or hemoptysis.  She denies any nausea, vomiting, constipation, or diarrhea.  She has no urinary complaints.  Patient offers no further specific complaints today.  Wt Readings from Last 3 Encounters:  06/16/23 124 lb (56.2 kg)  06/02/23 124 lb (56.2 kg)  05/26/23 123 lb (55.8 kg)     REVIEW OF SYSTEMS:   Review of Systems  Constitutional:  Positive for malaise/fatigue. Negative for fever and weight loss.  Respiratory: Negative.  Negative for cough, hemoptysis and shortness of breath.   Cardiovascular: Negative.  Negative for chest pain and leg swelling.  Gastrointestinal: Negative.  Negative for abdominal pain.  Genitourinary: Negative.  Negative for dysuria and hematuria.  Musculoskeletal: Negative.  Negative for back pain.  Skin: Negative.  Negative for rash.  Neurological:  Positive for weakness. Negative for dizziness, focal weakness and headaches.  Psychiatric/Behavioral:  The patient is not nervous/anxious.     As per HPI. Otherwise, a complete review of systems is negative.  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   DCIS (ductal carcinoma in situ)    Osteoarthritis    Personal history of radiation therapy     PAST SURGICAL HISTORY: Past Surgical History:  Procedure Laterality Date   BREAST BIOPSY Right 2012   +   BREAST LUMPECTOMY Right 2012   BUNIONECTOMY     CERVICAL LAMINECTOMY     ROTATOR CUFF REPAIR      FAMILY HISTORY: Family History  Problem Relation Age of Onset   Breast cancer Mother 67   Coronary artery disease Mother    Hypertension Mother    Stroke Mother    Heart attack Father     ADVANCED DIRECTIVES (Y/N):  N  HEALTH MAINTENANCE: Social History   Tobacco Use   Smoking status: Never   Smokeless tobacco: Never  Substance Use Topics   Alcohol use: Never   Drug use: Never     Colonoscopy:  PAP:  Bone density:  Lipid panel:  Allergies  Allergen Reactions   Quinine Hives    Current Outpatient Medications  Medication Sig Dispense Refill   Acetaminophen (TYLENOL) 325 MG CAPS Take by mouth.     calcium-vitamin D (OSCAL WITH D) 500-5 MG-MCG tablet Take 1 tablet by mouth daily with breakfast. 30 tablet 1   dexamethasone (DECADRON) 2 MG tablet Take 1 tablet (2 mg total) by mouth daily. 30 tablet 1   dicyclomine (BENTYL) 20 MG tablet Take by mouth.     diphenhydrAMINE (BENADRYL) 25 mg capsule Take 25 mg by mouth every 6 (six) hours as needed.     hydrOXYzine (ATARAX) 10 MG  tablet Take 0.5-1 tablets (5-10 mg total) by mouth 3 (three) times daily as needed for itching. 30 tablet 0   levothyroxine (SYNTHROID) 25 MCG tablet Take 25 mcg by mouth every morning.     lidocaine-prilocaine (EMLA) cream Apply to affected area once 30 g 3   ondansetron (ZOFRAN) 8 MG tablet Take 1 tablet (8 mg total) by mouth every 8 (eight) hours as needed for nausea or vomiting. 60 tablet 2   polyethylene glycol (MIRALAX) 17 g packet Take by mouth.     rosuvastatin (CRESTOR) 10 MG tablet Take 10 mg by mouth daily.     senna (SENOKOT) 8.6 MG tablet  Take 1 tablet by mouth at bedtime.     silver sulfADIAZINE (SILVADENE) 1 % cream Apply 1 Application topically daily.     traMADol (ULTRAM) 50 MG tablet Take 1 tablet (50 mg total) by mouth every 6 (six) hours as needed. 30 tablet 0   trimethoprim-polymyxin b (POLYTRIM) ophthalmic solution Place 1 drop into both eyes every 4 (four) hours.     apixaban (ELIQUIS) 5 MG TABS tablet Take 5 mg by mouth 2 (two) times daily.  (Patient not taking: Reported on 04/28/2023)     B Complex-Folic Acid (SM BALANCED B-100) TABS Take 1 tablet by mouth daily.  (Patient not taking: Reported on 04/28/2023)     Biotin 1000 MCG tablet Take 1,000 mcg by mouth daily.  (Patient not taking: Reported on 04/28/2023)     calcium carbonate (TUMS - DOSED IN MG ELEMENTAL CALCIUM) 500 MG chewable tablet Chew 1 tablet by mouth daily.  (Patient not taking: Reported on 04/28/2023)     CO ENZYME Q-10 PO Take 1 capsule by mouth daily.  (Patient not taking: Reported on 04/28/2023)     Cyanocobalamin (VITAMIN B 12 PO) Take 1 tablet by mouth daily.  (Patient not taking: Reported on 04/28/2023)     latanoprost (XALATAN) 0.005 % ophthalmic solution Place 1 drop into both eyes at bedtime. (Patient not taking: Reported on 04/28/2023)  5   Multiple Vitamin (MULTI-VITAMINS) TABS Take 1 tablet by mouth daily.  (Patient not taking: Reported on 04/28/2023)     ondansetron (ZOFRAN) 8 MG tablet Take 8 mg by mouth every 8 (eight) hours as needed. (Patient not taking: Reported on 06/02/2023)     Red Yeast Rice 600 MG TABS Take 2 tablets by mouth daily.  (Patient not taking: Reported on 04/28/2023)     SIMBRINZA 1-0.2 % SUSP INSTILL 1 DROP INTO BOTH EYES TWICE A DAY (Patient not taking: Reported on 04/28/2023)  5   vitamin C (ASCORBIC ACID) 500 MG tablet Take 500 mg by mouth daily.  (Patient not taking: Reported on 04/28/2023)     No current facility-administered medications for this visit.   Facility-Administered Medications Ordered in Other Visits  Medication Dose Route  Frequency Provider Last Rate Last Admin   sodium chloride flush (NS) 0.9 % injection 10 mL  10 mL Intravenous PRN Jeralyn Ruths, MD   10 mL at 05/26/23 0927    OBJECTIVE: Vitals:   06/16/23 1013  BP: (!) 173/91  Pulse: 72  Resp: 16  Temp: (!) 97 F (36.1 C)  SpO2: 100%     Body mass index is 18.31 kg/m.    ECOG FS:1 - Symptomatic but completely ambulatory  General: Thin, no acute distress.  Sitting in a wheelchair. Eyes: Pink conjunctiva, anicteric sclera. HEENT: Normocephalic, moist mucous membranes. Lungs: No audible wheezing or coughing. Heart: Regular rate and  rhythm. Abdomen: Soft, nontender, no obvious distention. Musculoskeletal: No edema, cyanosis, or clubbing. Neuro: Alert, answering all questions appropriately. Cranial nerves grossly intact. Skin: No rashes or petechiae noted. Psych: Normal affect.   LAB RESULTS:  Lab Results  Component Value Date   NA 138 06/16/2023   K 4.0 06/16/2023   CL 107 06/16/2023   CO2 25 06/16/2023   GLUCOSE 139 (H) 06/16/2023   BUN 35 (H) 06/16/2023   CREATININE 0.77 06/16/2023   CALCIUM 8.7 (L) 06/16/2023   PROT 6.5 06/16/2023   ALBUMIN 3.1 (L) 06/16/2023   AST 35 06/16/2023   ALT 46 (H) 06/16/2023   ALKPHOS 123 06/16/2023   BILITOT 0.2 (L) 06/16/2023   GFRNONAA >60 06/16/2023   GFRAA >60 05/12/2018    Lab Results  Component Value Date   WBC 6.5 06/16/2023   NEUTROABS 5.1 06/16/2023   HGB 10.2 (L) 06/16/2023   HCT 31.5 (L) 06/16/2023   MCV 96.9 06/16/2023   PLT 313 06/16/2023     STUDIES: No results found.  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  She is also undergone multiple rounds of treatment which included carboplatin and Taxol, Doxil, Keytruda/lenvatinib, and more recently Enhertu.  Patient has also had pelvic XRT.  Patient reports she has been off treatment since  approximately March 2024.  PET scan results from May 11, 2023 reviewed independently and reported as above with localized recurrence as well as multifocal peritoneal implants, retroperitoneal lymphadenopathy, and multifocal osseous metastasis. Hospice and end-of-life were previously discussed, but patient declined and wishes to pursue treatment.  Plan is to give single agent dose reduced gemcitabine on days 1 and 8 with day 15 off.  This will be a 21-day cycle.  Patient and her family expressed understanding that there are likely no further treatment options after this.  Patient here for evaluation and consideration of cycle 2, day 1.  Labs reviewed and acceptable for treatment. BP initially elevated but has resolved. Also followed by palliative care.  Bony metastasis: Patient will receive Zometa on day 1 of odd-numbered cycles. Hypocalcemia: Currently taking vitamin D and calcium supplementation over the past 6 days. Counts improved some. Continue . Vaginal bleeding: Patient does not complain of this today.  S/p gyn-onc input.  Patient has discontinued Eliquis.  Continue Venofer as scheduled.   Anemia: Chronic and essentially unchanged. Hgb 10.2 today. Venofer as above. History of DCIS: Patient completed tamoxifen in approximately 2019. Renal insufficiency: Patient's creatinine has normalized to 0.77. Poor appetite: Patient was given a prescription for dexamethasone 2 mg daily.  She will also receive IV steroids along with her fluids on Friday. Elevated LFT. New. Mild with an AST of 35 and ALT of 46. Will monitor.    Disposition.  Cycle 2 Gemcitabine today RTC on July 3 as previously planned.    Patient expressed understanding and was in agreement with this plan. She also understands that She can call clinic at any time with any questions, concerns, or complaints.    Cancer Staging  Endometrial cancer [facility]) Staging form: Corpus Uteri - Carcinoma and Carcinosarcoma, AJCC 8th Edition - Clinical  stage from 04/28/2023: FIGO Stage IVB (cTX, cNX, pM1) - Signed by Jeralyn Ruths, MD on 04/28/2023 Stage prefix: Initial diagnosis   Rushie Chestnut, PA-C   06/16/2023 10:58 AM

## 2023-06-17 ENCOUNTER — Ambulatory Visit
Admission: RE | Admit: 2023-06-17 | Discharge: 2023-06-17 | Disposition: A | Discharge status: Home / Self Care | Payer: Medicare Other | Source: Ambulatory Visit | Attending: Radiation Oncology | Admitting: Radiation Oncology

## 2023-06-17 ENCOUNTER — Other Ambulatory Visit: Payer: Self-pay

## 2023-06-17 DIAGNOSIS — Z5111 Encounter for antineoplastic chemotherapy: Secondary | ICD-10-CM | POA: Diagnosis not present

## 2023-06-17 DIAGNOSIS — Z510 Encounter for antineoplastic radiation therapy: Secondary | ICD-10-CM | POA: Diagnosis not present

## 2023-06-17 LAB — RAD ONC ARIA SESSION SUMMARY
Course Elapsed Days: 1
Plan Fractions Treated to Date: 2
Plan Prescribed Dose Per Fraction: 2 Gy
Plan Total Fractions Prescribed: 15
Plan Total Prescribed Dose: 30 Gy
Reference Point Dosage Given to Date: 4 Gy
Reference Point Session Dosage Given: 2 Gy
Session Number: 2

## 2023-06-18 ENCOUNTER — Other Ambulatory Visit: Payer: Self-pay

## 2023-06-18 ENCOUNTER — Ambulatory Visit
Admission: RE | Admit: 2023-06-18 | Discharge: 2023-06-18 | Disposition: A | Discharge status: Home / Self Care | Payer: Medicare Other | Source: Ambulatory Visit | Attending: Radiation Oncology | Admitting: Radiation Oncology

## 2023-06-18 DIAGNOSIS — Z5111 Encounter for antineoplastic chemotherapy: Secondary | ICD-10-CM | POA: Diagnosis not present

## 2023-06-18 DIAGNOSIS — Z510 Encounter for antineoplastic radiation therapy: Secondary | ICD-10-CM | POA: Diagnosis not present

## 2023-06-18 LAB — RAD ONC ARIA SESSION SUMMARY
Course Elapsed Days: 2
Plan Fractions Treated to Date: 3
Plan Prescribed Dose Per Fraction: 2 Gy
Plan Total Fractions Prescribed: 15
Plan Total Prescribed Dose: 30 Gy
Reference Point Dosage Given to Date: 6 Gy
Reference Point Session Dosage Given: 2 Gy
Session Number: 3

## 2023-06-21 ENCOUNTER — Ambulatory Visit
Admission: RE | Admit: 2023-06-21 | Discharge: 2023-06-21 | Disposition: A | Discharge status: Home / Self Care | Payer: Medicare Other | Source: Ambulatory Visit | Attending: Radiation Oncology | Admitting: Radiation Oncology

## 2023-06-21 ENCOUNTER — Other Ambulatory Visit: Payer: Self-pay

## 2023-06-21 DIAGNOSIS — C541 Malignant neoplasm of endometrium: Secondary | ICD-10-CM | POA: Diagnosis present

## 2023-06-21 LAB — RAD ONC ARIA SESSION SUMMARY
Course Elapsed Days: 5
Plan Fractions Treated to Date: 4
Plan Prescribed Dose Per Fraction: 2 Gy
Plan Total Fractions Prescribed: 15
Plan Total Prescribed Dose: 30 Gy
Reference Point Dosage Given to Date: 8 Gy
Reference Point Session Dosage Given: 2 Gy
Session Number: 4

## 2023-06-22 ENCOUNTER — Ambulatory Visit
Admission: RE | Admit: 2023-06-22 | Discharge: 2023-06-22 | Disposition: A | Discharge status: Home / Self Care | Payer: Medicare Other | Source: Ambulatory Visit | Attending: Radiation Oncology | Admitting: Radiation Oncology

## 2023-06-22 ENCOUNTER — Other Ambulatory Visit: Payer: Self-pay

## 2023-06-22 DIAGNOSIS — C541 Malignant neoplasm of endometrium: Secondary | ICD-10-CM | POA: Diagnosis not present

## 2023-06-22 LAB — RAD ONC ARIA SESSION SUMMARY
Course Elapsed Days: 6
Plan Fractions Treated to Date: 5
Plan Prescribed Dose Per Fraction: 2 Gy
Plan Total Fractions Prescribed: 15
Plan Total Prescribed Dose: 30 Gy
Reference Point Dosage Given to Date: 10 Gy
Reference Point Session Dosage Given: 2 Gy
Session Number: 5

## 2023-06-23 ENCOUNTER — Inpatient Hospital Stay (HOSPITAL_BASED_OUTPATIENT_CLINIC_OR_DEPARTMENT_OTHER): Payer: Medicare Other | Admitting: Oncology

## 2023-06-23 ENCOUNTER — Encounter: Payer: Self-pay | Admitting: Oncology

## 2023-06-23 ENCOUNTER — Other Ambulatory Visit: Payer: Self-pay

## 2023-06-23 ENCOUNTER — Inpatient Hospital Stay: Payer: Medicare Other | Attending: Oncology

## 2023-06-23 ENCOUNTER — Ambulatory Visit
Admission: RE | Admit: 2023-06-23 | Discharge: 2023-06-23 | Disposition: A | Discharge status: Home / Self Care | Payer: Medicare Other | Source: Ambulatory Visit | Attending: Radiation Oncology | Admitting: Radiation Oncology

## 2023-06-23 ENCOUNTER — Inpatient Hospital Stay: Payer: Medicare Other

## 2023-06-23 VITALS — BP 142/64 | HR 87 | Temp 97.1°F | Resp 18

## 2023-06-23 VITALS — BP 145/69 | HR 65 | Temp 97.8°F | Resp 16 | Ht 69.0 in | Wt 121.0 lb

## 2023-06-23 DIAGNOSIS — C541 Malignant neoplasm of endometrium: Secondary | ICD-10-CM | POA: Diagnosis not present

## 2023-06-23 DIAGNOSIS — Z86000 Personal history of in-situ neoplasm of breast: Secondary | ICD-10-CM | POA: Insufficient documentation

## 2023-06-23 DIAGNOSIS — D649 Anemia, unspecified: Secondary | ICD-10-CM | POA: Insufficient documentation

## 2023-06-23 DIAGNOSIS — Z803 Family history of malignant neoplasm of breast: Secondary | ICD-10-CM | POA: Diagnosis not present

## 2023-06-23 DIAGNOSIS — Z5111 Encounter for antineoplastic chemotherapy: Secondary | ICD-10-CM | POA: Insufficient documentation

## 2023-06-23 DIAGNOSIS — C7951 Secondary malignant neoplasm of bone: Secondary | ICD-10-CM | POA: Diagnosis not present

## 2023-06-23 DIAGNOSIS — R634 Abnormal weight loss: Secondary | ICD-10-CM | POA: Diagnosis not present

## 2023-06-23 DIAGNOSIS — C787 Secondary malignant neoplasm of liver and intrahepatic bile duct: Secondary | ICD-10-CM | POA: Insufficient documentation

## 2023-06-23 DIAGNOSIS — Z8673 Personal history of transient ischemic attack (TIA), and cerebral infarction without residual deficits: Secondary | ICD-10-CM | POA: Insufficient documentation

## 2023-06-23 DIAGNOSIS — C55 Malignant neoplasm of uterus, part unspecified: Secondary | ICD-10-CM | POA: Insufficient documentation

## 2023-06-23 LAB — CMP (CANCER CENTER ONLY)
ALT: 30 U/L (ref 0–44)
AST: 25 U/L (ref 15–41)
Albumin: 3.2 g/dL — ABNORMAL LOW (ref 3.5–5.0)
Alkaline Phosphatase: 132 U/L — ABNORMAL HIGH (ref 38–126)
Anion gap: 8 (ref 5–15)
BUN: 43 mg/dL — ABNORMAL HIGH (ref 8–23)
CO2: 25 mmol/L (ref 22–32)
Calcium: 8.4 mg/dL — ABNORMAL LOW (ref 8.9–10.3)
Chloride: 105 mmol/L (ref 98–111)
Creatinine: 0.87 mg/dL (ref 0.44–1.00)
GFR, Estimated: >60 mL/min (ref >60)
Glucose, Bld: 159 mg/dL — ABNORMAL HIGH (ref 70–99)
Potassium: 3.9 mmol/L (ref 3.5–5.1)
Sodium: 138 mmol/L (ref 135–145)
Total Bilirubin: 0.4 mg/dL (ref 0.3–1.2)
Total Protein: 6.6 g/dL (ref 6.5–8.1)

## 2023-06-23 LAB — CBC WITH DIFFERENTIAL (CANCER CENTER ONLY)
Abs Immature Granulocytes: 0.05 10*3/uL (ref 0.00–0.07)
Basophils Absolute: 0 10*3/uL (ref 0.0–0.1)
Basophils Relative: 0 %
Eosinophils Absolute: 0 10*3/uL (ref 0.0–0.5)
Eosinophils Relative: 0 %
HCT: 35.1 % — ABNORMAL LOW (ref 36.0–46.0)
Hemoglobin: 11.3 g/dL — ABNORMAL LOW (ref 12.0–15.0)
Immature Granulocytes: 1 %
Lymphocytes Relative: 13 %
Lymphs Abs: 0.7 10*3/uL (ref 0.7–4.0)
MCH: 30.5 pg (ref 26.0–34.0)
MCHC: 32.2 g/dL (ref 30.0–36.0)
MCV: 94.9 fL (ref 80.0–100.0)
Monocytes Absolute: 0.4 10*3/uL (ref 0.1–1.0)
Monocytes Relative: 8 %
Neutro Abs: 4.4 10*3/uL (ref 1.7–7.7)
Neutrophils Relative %: 78 %
Platelet Count: 155 10*3/uL (ref 150–400)
RBC: 3.7 MIL/uL — ABNORMAL LOW (ref 3.87–5.11)
RDW: 15.8 % — ABNORMAL HIGH (ref 11.5–15.5)
WBC Count: 5.6 10*3/uL (ref 4.0–10.5)
nRBC: 0 % (ref 0.0–0.2)

## 2023-06-23 LAB — RAD ONC ARIA SESSION SUMMARY
Course Elapsed Days: 7
Plan Fractions Treated to Date: 6
Plan Prescribed Dose Per Fraction: 2 Gy
Plan Total Fractions Prescribed: 15
Plan Total Prescribed Dose: 30 Gy
Reference Point Dosage Given to Date: 12 Gy
Reference Point Session Dosage Given: 2 Gy
Session Number: 6

## 2023-06-23 MED ORDER — SODIUM CHLORIDE 0.9 % IV SOLN
800.0000 mg/m2 | Freq: Once | INTRAVENOUS | Status: AC
  Administered 2023-06-23: 1330 mg via INTRAVENOUS
  Filled 2023-06-23: qty 8.68

## 2023-06-23 MED ORDER — HEPARIN SOD (PORK) LOCK FLUSH 100 UNIT/ML IV SOLN
500.0000 [IU] | Freq: Once | INTRAVENOUS | Status: AC | PRN
  Administered 2023-06-23: 500 [IU]
  Filled 2023-06-23: qty 5

## 2023-06-23 MED ORDER — SODIUM CHLORIDE 0.9 % IV SOLN
Freq: Once | INTRAVENOUS | Status: AC
  Filled 2023-06-23: qty 250

## 2023-06-23 MED ORDER — PROCHLORPERAZINE MALEATE 10 MG PO TABS
10.0000 mg | ORAL_TABLET | Freq: Once | ORAL | Status: AC
  Administered 2023-06-23: 10 mg via ORAL
  Filled 2023-06-23: qty 1

## 2023-06-23 NOTE — Patient Instructions (Signed)
[place] [facility]  Discharge Instructions: Thank you for choosing [facility] to provide your oncology and hematology care.  If you have a lab appointment with the Cancer Center, please go directly to the Cancer Center and check in at the registration area.  Wear comfortable clothing and clothing appropriate for easy access to any Portacath or PICC line.   We strive to give you quality time with your provider. You may need to reschedule your appointment if you arrive late (15 or more minutes).  Arriving late affects you and other patients whose appointments are after yours.  Also, if you miss three or more appointments without notifying the office, you may be dismissed from the clinic at the provider's discretion.      For prescription refill requests, have your pharmacy contact our office and allow 72 hours for refills to be completed.    Today you received the following chemotherapy and/or immunotherapy agents gemcitabine      To help prevent nausea and vomiting after your treatment, we encourage you to take your nausea medication as directed.  BELOW ARE SYMPTOMS THAT SHOULD BE REPORTED IMMEDIATELY: *FEVER GREATER THAN 100.4 F (38 C) OR HIGHER *CHILLS OR SWEATING *NAUSEA AND VOMITING THAT IS NOT CONTROLLED WITH YOUR NAUSEA MEDICATION *UNUSUAL SHORTNESS OF BREATH *UNUSUAL BRUISING OR BLEEDING *URINARY PROBLEMS (pain or burning when urinating, or frequent urination) *BOWEL PROBLEMS (unusual diarrhea, constipation, pain near the anus) TENDERNESS IN MOUTH AND THROAT WITH OR WITHOUT PRESENCE OF ULCERS (sore throat, sores in mouth, or a toothache) UNUSUAL RASH, SWELLING OR PAIN  UNUSUAL VAGINAL DISCHARGE OR ITCHING   Items with * indicate a potential emergency and should be followed up as soon as possible or go to the Emergency Department if any problems should occur.  Please show the CHEMOTHERAPY ALERT CARD or IMMUNOTHERAPY ALERT CARD at check-in  to the Emergency Department and triage nurse.  Should you have questions after your visit or need to cancel or reschedule your appointment, please contact [place] [facility]  [phone] and follow the prompts.  Office hours are 8:00 a.m. to 4:30 p.m. Monday - Friday. Please note that voicemails left after 4:00 p.m. may not be returned until the following business day.  We are closed weekends and major holidays. You have access to a nurse at all times for urgent questions. Please call the main number to the clinic [phone] and follow the prompts.  For any non-urgent questions, you may also contact your provider using MyChart. We now offer e-Visits for anyone 43 and older to request care online for non-urgent symptoms. For details visit [email or web address].   Also download the MyChart app! Go to the app store, search "MyChart", open the app, select [place], and log in with your MyChart username and password.

## 2023-06-23 NOTE — Progress Notes (Signed)
[facility]  Telephone:(336) [phone] Fax:(336) [phone]  ID: ANAMTA Olivia Burton OB: 22-Dec-1940  MR#: [id number]  HQI#:[id number]  Patient Care Team: Kandyce Rud, MD as PCP - General (Family Medicine) Benita Gutter, RN as Oncology Nurse Navigator Orlie Dakin, Tollie Pizza, MD as Consulting Physician (Oncology)  CHIEF COMPLAINT: Progressive stage IV uterine cancer  INTERVAL HISTORY: Patient returns to clinic today for further evaluation and consideration of cycle 2, day 8 of single agent gemcitabine.  She is tolerating her treatments well without significant side effects.  She continues to have chronic weakness and fatigue.  Her appetite has improved.  She denies any pain.  She has no neurologic complaints.  She denies any recent fevers. She has no chest pain, shortness of breath, cough, or hemoptysis.  She denies any nausea, vomiting, constipation, or diarrhea.  She has no urinary complaints.  Patient offers no further specific complaints today.  REVIEW OF SYSTEMS:   Review of Systems  Constitutional:  Positive for malaise/fatigue. Negative for fever and weight loss.  Respiratory: Negative.  Negative for cough, hemoptysis and shortness of breath.   Cardiovascular: Negative.  Negative for chest pain and leg swelling.  Gastrointestinal: Negative.  Negative for abdominal pain.  Genitourinary: Negative.  Negative for dysuria and hematuria.  Musculoskeletal: Negative.  Negative for back pain.  Skin: Negative.  Negative for rash.  Neurological:  Positive for weakness. Negative for dizziness, focal weakness and headaches.  Psychiatric/Behavioral:  The patient is not nervous/anxious.     As per HPI. Otherwise, a complete review of systems is negative.  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   DCIS (ductal carcinoma in situ)    Osteoarthritis    Personal history of radiation therapy     PAST SURGICAL  HISTORY: Past Surgical History:  Procedure Laterality Date   BREAST BIOPSY Right 2012   +   BREAST LUMPECTOMY Right 2012   BUNIONECTOMY     CERVICAL LAMINECTOMY     ROTATOR CUFF REPAIR      FAMILY HISTORY: Family History  Problem Relation Age of Onset   Breast cancer Mother 6   Coronary artery disease Mother    Hypertension Mother    Stroke Mother    Heart attack Father     ADVANCED DIRECTIVES (Y/N):  N  HEALTH MAINTENANCE: Social History   Tobacco Use   Smoking status: Never   Smokeless tobacco: Never  Substance Use Topics   Alcohol use: Never   Drug use: Never     Colonoscopy:  PAP:  Bone density:  Lipid panel:  Allergies  Allergen Reactions   Quinine Hives    Current Outpatient Medications  Medication Sig Dispense Refill   Acetaminophen (TYLENOL) 325 MG CAPS Take by mouth.     calcium-vitamin D (OSCAL WITH D) 500-5 MG-MCG tablet Take 1 tablet by mouth daily with breakfast. 30 tablet 1   dexamethasone (DECADRON) 2 MG tablet Take 1 tablet (2 mg total) by mouth daily. 30 tablet 1   dicyclomine (BENTYL) 20 MG tablet Take by mouth.     diphenhydrAMINE (BENADRYL) 25 mg capsule Take 25 mg by mouth every 6 (six) hours as needed.     hydrOXYzine (ATARAX) 10 MG tablet Take 0.5-1 tablets (5-10 mg total) by mouth 3 (three) times daily as needed for itching. 30 tablet 0   levothyroxine (SYNTHROID) 25 MCG tablet Take 25 mcg by mouth every morning.  lidocaine-prilocaine (EMLA) cream Apply to affected area once 30 g 3   ondansetron (ZOFRAN) 8 MG tablet Take 1 tablet (8 mg total) by mouth every 8 (eight) hours as needed for nausea or vomiting. 60 tablet 2   polyethylene glycol (MIRALAX) 17 g packet Take by mouth.     senna (SENOKOT) 8.6 MG tablet Take 1 tablet by mouth at bedtime.     silver sulfADIAZINE (SILVADENE) 1 % cream Apply 1 Application topically daily.     SIMBRINZA 1-0.2 % SUSP   5   traMADol (ULTRAM) 50 MG tablet Take 1 tablet (50 mg total) by mouth every  6 (six) hours as needed. 30 tablet 0   trimethoprim-polymyxin b (POLYTRIM) ophthalmic solution Place 1 drop into both eyes every 4 (four) hours.     apixaban (ELIQUIS) 5 MG TABS tablet Take 5 mg by mouth 2 (two) times daily.  (Patient not taking: Reported on 04/28/2023)     B Complex-Folic Acid (SM BALANCED B-100) TABS Take 1 tablet by mouth daily.  (Patient not taking: Reported on 04/28/2023)     Biotin 1000 MCG tablet Take 1,000 mcg by mouth daily.  (Patient not taking: Reported on 04/28/2023)     calcium carbonate (TUMS - DOSED IN MG ELEMENTAL CALCIUM) 500 MG chewable tablet Chew 1 tablet by mouth daily.  (Patient not taking: Reported on 04/28/2023)     CO ENZYME Q-10 PO Take 1 capsule by mouth daily.  (Patient not taking: Reported on 04/28/2023)     Cyanocobalamin (VITAMIN B 12 PO) Take 1 tablet by mouth daily.  (Patient not taking: Reported on 04/28/2023)     latanoprost (XALATAN) 0.005 % ophthalmic solution Place 1 drop into both eyes at bedtime. (Patient not taking: Reported on 04/28/2023)  5   Multiple Vitamin (MULTI-VITAMINS) TABS Take 1 tablet by mouth daily.  (Patient not taking: Reported on 04/28/2023)     ondansetron (ZOFRAN) 8 MG tablet Take 8 mg by mouth every 8 (eight) hours as needed. (Patient not taking: Reported on 06/02/2023)     Red Yeast Rice 600 MG TABS Take 2 tablets by mouth daily.  (Patient not taking: Reported on 04/28/2023)     rosuvastatin (CRESTOR) 10 MG tablet Take 10 mg by mouth daily.     vitamin C (ASCORBIC ACID) 500 MG tablet Take 500 mg by mouth daily.  (Patient not taking: Reported on 04/28/2023)     No current facility-administered medications for this visit.   Facility-Administered Medications Ordered in Other Visits  Medication Dose Route Frequency Provider Last Rate Last Admin   sodium chloride flush (NS) 0.9 % injection 10 mL  10 mL Intravenous PRN Jeralyn Ruths, MD   10 mL at 05/26/23 0927    OBJECTIVE: Vitals:   06/23/23 1015  BP: (!) 145/69  Pulse: 65  Resp:  16  Temp: 97.8 F (36.6 C)  SpO2: 98%     Body mass index is 17.87 kg/m.    ECOG FS:1 - Symptomatic but completely ambulatory  General: Thin, no acute distress.  Sitting in a wheelchair. Eyes: Pink conjunctiva, anicteric sclera. HEENT: Normocephalic, moist mucous membranes. Lungs: No audible wheezing or coughing. Heart: Regular rate and rhythm. Abdomen: Soft, nontender, no obvious distention. Musculoskeletal: No edema, cyanosis, or clubbing. Neuro: Alert, answering all questions appropriately. Cranial nerves grossly intact. Skin: No rashes or petechiae noted. Psych: Normal affect.  LAB RESULTS:  Lab Results  Component Value Date   NA 138 06/23/2023   K 3.9 06/23/2023  CL 105 06/23/2023   CO2 25 06/23/2023   GLUCOSE 159 (H) 06/23/2023   BUN 43 (H) 06/23/2023   CREATININE 0.87 06/23/2023   CALCIUM 8.4 (L) 06/23/2023   PROT 6.6 06/23/2023   ALBUMIN 3.2 (L) 06/23/2023   AST 25 06/23/2023   ALT 30 06/23/2023   ALKPHOS 132 (H) 06/23/2023   BILITOT 0.4 06/23/2023   GFRNONAA >60 06/23/2023   GFRAA >60 05/12/2018    Lab Results  Component Value Date   WBC 5.6 06/23/2023   NEUTROABS 4.4 06/23/2023   HGB 11.3 (L) 06/23/2023   HCT 35.1 (L) 06/23/2023   MCV 94.9 06/23/2023   PLT 155 06/23/2023     STUDIES: No results found.  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  She is also undergone multiple rounds of treatment which included carboplatin and Taxol, Doxil, Keytruda/lenvatinib, and more recently Enhertu.  Patient has also had pelvic XRT.  Patient reports she has been off treatment since approximately March 2024.  PET scan results from May 11, 2023 reviewed independently and reported as above with localized recurrence as well as multifocal peritoneal implants, retroperitoneal lymphadenopathy, and multifocal osseous metastasis. Hospice  and end-of-life were previously discussed, but patient declined and wishes to pursue treatment.  Plan is to give single agent dose reduced gemcitabine on days 1 and 8 with day 15 off.  This will be a 21-day cycle.  Patient and her family expressed understanding that there are likely no further treatment options after this.  Proceed with cycle 2, day 8 of treatment today.  Return to clinic in 2 weeks for further evaluation and consideration of cycle 3, day 1.  Plan to reimage at the conclusion of cycle 3.   Bony metastasis: Patient will receive Zometa on day 1 of odd-numbered cycles. Hypocalcemia: Mild.  Continue oral calcium supplementation. Vaginal bleeding: Resolved.  Patient has discontinued Eliquis.  Continue Venofer as scheduled.   Anemia: Hemoglobin has trended up to 11.3.  Venofer as above. History of DCIS: Patient completed tamoxifen in approximately 2019. Renal insufficiency: Resolved.   Poor appetite: Improved.  Will send dietary referral.  Patient expressed understanding and was in agreement with this plan. She also understands that She can call clinic at any time with any questions, concerns, or complaints.    Cancer Staging  Endometrial cancer [facility]) Staging form: Corpus Uteri - Carcinoma and Carcinosarcoma, AJCC 8th Edition - Clinical stage from 04/28/2023: FIGO Stage IVB (cTX, cNX, pM1) - Signed by Jeralyn Ruths, MD on 04/28/2023 Stage prefix: Initial diagnosis   Jeralyn Ruths, MD   06/23/2023 1:03 PM

## 2023-06-23 NOTE — Addendum Note (Signed)
Addended by: Luz Lex on: 06/23/2023 01:09 PM   Modules accepted: Orders

## 2023-06-25 ENCOUNTER — Ambulatory Visit
Admission: RE | Admit: 2023-06-25 | Discharge: 2023-06-25 | Disposition: A | Discharge status: Home / Self Care | Payer: Medicare Other | Source: Ambulatory Visit | Attending: Radiation Oncology | Admitting: Radiation Oncology

## 2023-06-25 ENCOUNTER — Other Ambulatory Visit: Payer: Self-pay

## 2023-06-25 DIAGNOSIS — C541 Malignant neoplasm of endometrium: Secondary | ICD-10-CM | POA: Diagnosis not present

## 2023-06-25 LAB — RAD ONC ARIA SESSION SUMMARY
Course Elapsed Days: 9
Plan Fractions Treated to Date: 7
Plan Prescribed Dose Per Fraction: 2 Gy
Plan Total Fractions Prescribed: 15
Plan Total Prescribed Dose: 30 Gy
Reference Point Dosage Given to Date: 14 Gy
Reference Point Session Dosage Given: 2 Gy
Session Number: 7

## 2023-06-28 ENCOUNTER — Ambulatory Visit: Payer: Medicare Other

## 2023-06-29 ENCOUNTER — Ambulatory Visit: Payer: Medicare Other

## 2023-06-30 ENCOUNTER — Ambulatory Visit: Payer: Medicare Other

## 2023-07-01 ENCOUNTER — Ambulatory Visit: Payer: Medicare Other

## 2023-07-02 ENCOUNTER — Inpatient Hospital Stay: Payer: Medicare Other | Admitting: Hospice and Palliative Medicine

## 2023-07-02 ENCOUNTER — Ambulatory Visit: Payer: Medicare Other

## 2023-07-05 ENCOUNTER — Other Ambulatory Visit: Payer: Self-pay | Admitting: *Deleted

## 2023-07-05 ENCOUNTER — Ambulatory Visit: Payer: Medicare Other

## 2023-07-05 ENCOUNTER — Other Ambulatory Visit: Payer: Self-pay | Admitting: Oncology

## 2023-07-05 DIAGNOSIS — C541 Malignant neoplasm of endometrium: Secondary | ICD-10-CM

## 2023-07-05 MED ORDER — OYSTER SHELL CALCIUM/D3 500-5 MG-MCG PO TABS: 1.0000 | ORAL_TABLET | Freq: Every day | ORAL | 3 refills | Status: DC

## 2023-07-05 NOTE — Telephone Encounter (Signed)
Pharmacy request for 90 days supply of oscal

## 2023-07-06 ENCOUNTER — Ambulatory Visit: Payer: Medicare Other

## 2023-07-06 ENCOUNTER — Encounter: Payer: Self-pay | Admitting: Oncology

## 2023-07-06 ENCOUNTER — Inpatient Hospital Stay: Payer: Medicare Other

## 2023-07-06 NOTE — Progress Notes (Signed)
Nutrition  Patient scheduled for telephone visit.  Called and husband answered.  He currently in the ER in [place] [facility]) with patient.  He had a concern about patient aspirating during the night and he took her back to the ER.  He says that she was admitted (7/6) for about 5 days and found to have had a stroke.  She was placed on a pureed diet.  Husband says she was eating well but not drinking enough fluids.    Message sent to Dr Orlie Dakin and team regarding patient back in the ER.  Will need to get patient rescheduled with nutrition.  Foye Damron B. Freida Busman, RD, LDN Registered Dietitian [phone]

## 2023-07-07 ENCOUNTER — Ambulatory Visit: Payer: Medicare Other

## 2023-07-07 ENCOUNTER — Inpatient Hospital Stay: Payer: Medicare Other

## 2023-07-07 ENCOUNTER — Inpatient Hospital Stay: Payer: Medicare Other | Admitting: Oncology

## 2023-07-08 ENCOUNTER — Ambulatory Visit: Payer: Medicare Other

## 2023-07-09 ENCOUNTER — Ambulatory Visit: Payer: Medicare Other

## 2023-07-12 ENCOUNTER — Ambulatory Visit: Payer: Medicare Other | Admitting: Oncology

## 2023-07-12 ENCOUNTER — Ambulatory Visit: Payer: Medicare Other

## 2023-07-13 ENCOUNTER — Ambulatory Visit: Payer: Medicare Other

## 2023-07-13 ENCOUNTER — Encounter: Payer: Self-pay | Admitting: Oncology

## 2023-07-13 ENCOUNTER — Inpatient Hospital Stay (HOSPITAL_BASED_OUTPATIENT_CLINIC_OR_DEPARTMENT_OTHER): Payer: Medicare Other | Admitting: Oncology

## 2023-07-13 ENCOUNTER — Ambulatory Visit
Admission: RE | Admit: 2023-07-13 | Discharge: 2023-07-13 | Disposition: A | Discharge status: Home / Self Care | Payer: Medicare Other | Source: Ambulatory Visit | Attending: Radiation Oncology | Admitting: Radiation Oncology

## 2023-07-13 ENCOUNTER — Encounter: Payer: Self-pay | Admitting: Radiation Oncology

## 2023-07-13 VITALS — BP 120/82 | HR 77 | Resp 14

## 2023-07-13 DIAGNOSIS — C541 Malignant neoplasm of endometrium: Secondary | ICD-10-CM

## 2023-07-13 DIAGNOSIS — Z5111 Encounter for antineoplastic chemotherapy: Secondary | ICD-10-CM | POA: Diagnosis not present

## 2023-07-13 NOTE — Progress Notes (Signed)
Radiation Oncology Follow up Note  Name: Olivia Burton   Date:   07/13/2023 MRN:  [id number] DOB: 13-Dec-1941    This 82 y.o. 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  Bleeding has subsided.  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  And was treated at [facility] for that.  She is slowly recovering from that she has been off treatment now for approximately 3 weeks.  Accompanied by family today.  COMPLICATIONS OF TREATMENT: none  FOLLOW UP COMPLIANCE: keeps appointments   PHYSICAL EXAM:  BP 120/82   Pulse 77   Resp 14  Frail-appearing female obvious recent neurologic compromise secondary to CVA.  Well-developed well-nourished patient in NAD. HEENT reveals PERLA, EOMI, discs not visualized.  Oral cavity is clear. No oral mucosal lesions are identified. Neck is clear without evidence of cervical or supraclavicular adenopathy. Lungs are clear to A&P. Cardiac examination is essentially unremarkable with regular rate and rhythm without murmur rub or thrill. Abdomen is benign with no organomegaly or masses noted. Motor sensory and DTR levels are equal and symmetric in the upper and lower extremities. Cranial nerves II through XII are grossly intact. Proprioception is intact. No peripheral adenopathy or edema is identified. No motor or sensory levels are noted. Crude visual fields are within normal range.  RADIOLOGY RESULTS: No current films for review  PLAN: At the present time I am going to discontinue any palliative radiation therapy.  Bleeding 6 has subsided.  I have left the door open should that occur again we have plenty of  room for further palliative radiation.  She did did receive half her palliative dose of radiation.  Family comprehends my recommendations well.  Continue follow-up care with Dr. Orlie Dakin.  I would like to take this opportunity to thank you for allowing me to participate in the care of your patient.Carmina Miller, MD

## 2023-07-13 NOTE — Progress Notes (Signed)
[facility]  [phone] Fax:(336) [phone]  ID: Olivia Burton OB: 1941/09/07  MR#: [id number]  AOZ#:[id number]  Patient Care Team: Kandyce Rud, MD as PCP - General (Family Medicine) Benita Gutter, RN as Oncology Nurse Navigator Orlie Dakin, Tollie Pizza, MD as Consulting Physician (Oncology)  CHIEF COMPLAINT: Progressive stage IV uterine cancer  INTERVAL HISTORY: Patient returns to clinic today for further evaluation after recently having a stroke and being admitted to the hospital in [place], [place].  She is not fully back to baseline, but her son reports she has not significantly improved over the past 1 to 2 weeks.  She has significant weakness and fatigue and a decreased performance status.  She denies any pain.  She has no neurologic complaints.  She denies any dysphagia or difficulty swallowing.  She denies any recent fevers. She has no chest pain, shortness of breath, cough, or hemoptysis.  She denies any nausea, vomiting, constipation, or diarrhea.  She has no urinary complaints.  Patient offers no further specific complaints today.  REVIEW OF SYSTEMS:   Review of Systems  Constitutional:  Positive for malaise/fatigue. Negative for fever and weight loss.  Respiratory: Negative.  Negative for cough, hemoptysis and shortness of breath.   Cardiovascular: Negative.  Negative for chest pain and leg swelling.  Gastrointestinal: Negative.  Negative for abdominal pain.  Genitourinary: Negative.  Negative for dysuria and hematuria.  Musculoskeletal: Negative.  Negative for back pain.  Skin: Negative.  Negative for rash.  Neurological:  Positive for weakness. Negative for dizziness, focal weakness and headaches.  Psychiatric/Behavioral:  The patient is not nervous/anxious.     As per HPI. Otherwise, a complete review of systems is negative.  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    DCIS (ductal carcinoma in situ)    Osteoarthritis    Personal history of radiation therapy     PAST SURGICAL HISTORY: Past Surgical History:  Procedure Laterality Date   BREAST BIOPSY Right 2012   +   BREAST LUMPECTOMY Right 2012   BUNIONECTOMY     CERVICAL LAMINECTOMY     ROTATOR CUFF REPAIR      FAMILY HISTORY: Family History  Problem Relation Age of Onset   Breast cancer Mother 32   Coronary artery disease Mother    Hypertension Mother    Stroke Mother    Heart attack Father     ADVANCED DIRECTIVES (Y/N):  N  HEALTH MAINTENANCE: Social History   Tobacco Use   Smoking status: Never   Smokeless tobacco: Never  Substance Use Topics   Alcohol use: Never   Drug use: Never     Colonoscopy:  PAP:  Bone density:  Lipid panel:  Allergies  Allergen Reactions   Quinine Hives    Current Outpatient Medications  Medication Sig Dispense Refill   Acetaminophen (TYLENOL) 325 MG CAPS Take by mouth.     apixaban (ELIQUIS) 5 MG TABS tablet Take 5 mg by mouth 2 (two) times daily.  (Patient not taking: Reported on 04/28/2023)     B Complex-Folic Acid (SM BALANCED B-100) TABS Take 1 tablet by mouth daily.  (Patient not taking: Reported on 04/28/2023)     Biotin 1000 MCG tablet Take 1,000 mcg by mouth daily.  (Patient not taking: Reported on 04/28/2023)     calcium carbonate (TUMS - DOSED IN MG ELEMENTAL CALCIUM) 500 MG chewable tablet Chew 1 tablet by mouth daily.  (Patient  not taking: Reported on 04/28/2023)     calcium-vitamin D (OSCAL WITH D) 500-5 MG-MCG tablet Take 1 tablet by mouth daily with breakfast. 90 tablet 3   CO ENZYME Q-10 PO Take 1 capsule by mouth daily.  (Patient not taking: Reported on 04/28/2023)     Cyanocobalamin (VITAMIN B 12 PO) Take 1 tablet by mouth daily.  (Patient not taking: Reported on 04/28/2023)     dexamethasone (DECADRON) 2 MG tablet Take 1 tablet (2 mg total) by mouth daily. 30 tablet 1   dicyclomine (BENTYL) 20 MG tablet Take by mouth.      diphenhydrAMINE (BENADRYL) 25 mg capsule Take 25 mg by mouth every 6 (six) hours as needed.     hydrOXYzine (ATARAX) 10 MG tablet Take 0.5-1 tablets (5-10 mg total) by mouth 3 (three) times daily as needed for itching. 30 tablet 0   latanoprost (XALATAN) 0.005 % ophthalmic solution Place 1 drop into both eyes at bedtime. (Patient not taking: Reported on 04/28/2023)  5   levothyroxine (SYNTHROID) 25 MCG tablet Take 25 mcg by mouth every morning.     lidocaine-prilocaine (EMLA) cream Apply to affected area once 30 g 3   Multiple Vitamin (MULTI-VITAMINS) TABS Take 1 tablet by mouth daily.  (Patient not taking: Reported on 04/28/2023)     ondansetron (ZOFRAN) 8 MG tablet Take 8 mg by mouth every 8 (eight) hours as needed. (Patient not taking: Reported on 06/02/2023)     ondansetron (ZOFRAN) 8 MG tablet Take 1 tablet (8 mg total) by mouth every 8 (eight) hours as needed for nausea or vomiting. 60 tablet 2   polyethylene glycol (MIRALAX) 17 g packet Take by mouth.     Red Yeast Rice 600 MG TABS Take 2 tablets by mouth daily.  (Patient not taking: Reported on 04/28/2023)     rosuvastatin (CRESTOR) 10 MG tablet Take 10 mg by mouth daily.     senna (SENOKOT) 8.6 MG tablet Take 1 tablet by mouth at bedtime.     silver sulfADIAZINE (SILVADENE) 1 % cream Apply 1 Application topically daily.     SIMBRINZA 1-0.2 % SUSP   5   traMADol (ULTRAM) 50 MG tablet Take 1 tablet (50 mg total) by mouth every 6 (six) hours as needed. 30 tablet 0   trimethoprim-polymyxin b (POLYTRIM) ophthalmic solution Place 1 drop into both eyes every 4 (four) hours.     vitamin C (ASCORBIC ACID) 500 MG tablet Take 500 mg by mouth daily.  (Patient not taking: Reported on 04/28/2023)     No current facility-administered medications for this visit.   Facility-Administered Medications Ordered in Other Visits  Medication Dose Route Frequency Provider Last Rate Last Admin   sodium chloride flush (NS) 0.9 % injection 10 mL  10 mL Intravenous PRN  Jeralyn Ruths, MD   10 mL at 05/26/23 0927    OBJECTIVE: Vitals:   07/13/23 1454  BP: 120/82  Pulse: 77  Resp: 14     There is no height or weight on file to calculate BMI.    ECOG FS:3 - Symptomatic, >50% confined to bed  General: Thin, no acute distress.  Sitting in wheelchair. Eyes: Pink conjunctiva, anicteric sclera. HEENT: Normocephalic, moist mucous membranes. Lungs: No audible wheezing or coughing. Heart: Regular rate and rhythm. Abdomen: Soft, nontender, no obvious distention. Musculoskeletal: No edema, cyanosis, or clubbing. Neuro: Alert, answering all questions appropriately. Cranial nerves grossly intact. Skin: No rashes or petechiae noted. Psych: Flat affect.   LAB RESULTS:  Lab Results  Component Value Date   NA 138 06/23/2023   K 3.9 06/23/2023   CL 105 06/23/2023   CO2 25 06/23/2023   GLUCOSE 159 (H) 06/23/2023   BUN 43 (H) 06/23/2023   CREATININE 0.87 06/23/2023   CALCIUM 8.4 (L) 06/23/2023   PROT 6.6 06/23/2023   ALBUMIN 3.2 (L) 06/23/2023   AST 25 06/23/2023   ALT 30 06/23/2023   ALKPHOS 132 (H) 06/23/2023   BILITOT 0.4 06/23/2023   GFRNONAA >60 06/23/2023   GFRAA >60 05/12/2018    Lab Results  Component Value Date   WBC 5.6 06/23/2023   NEUTROABS 4.4 06/23/2023   HGB 11.3 (L) 06/23/2023   HCT 35.1 (L) 06/23/2023   MCV 94.9 06/23/2023   PLT 155 06/23/2023     STUDIES: No results found.  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  She is also undergone multiple rounds of treatment which included carboplatin and Taxol, Doxil, Keytruda/lenvatinib, and more recently Enhertu.  Patient has also had pelvic XRT.  Patient reports she has been off treatment since approximately March 2024.  PET scan results from May 11, 2023 reviewed independently and reported as above with localized recurrence as well as  multifocal peritoneal implants, retroperitoneal lymphadenopathy, and multifocal osseous metastasis. Hospice and end-of-life were previously discussed.  Patient has not had chemotherapy since June 23, 2023.  Given her recent stroke and significantly decreased performance status, no treatments are planned at this time.  Patient will return to clinic in 3 weeks for further evaluation.   Bony metastasis: Patient will receive Zometa on day 1 of odd-numbered cycles. Hypocalcemia: Mild.  Continue oral calcium supplementation. Vaginal bleeding: Resolved.  Patient is now back on Eliquis after her CVA.   Anemia: Patient's recent hemoglobin was 11.3, monitor. History of DCIS: Patient completed tamoxifen in approximately 2019. Renal insufficiency: Resolved.   CVA: Patient has a significantly decreased performance status now.  Hospice and end-of-life were not discussed today but patient's family expressed understanding that restarting treatment would be difficult.  Patient expressed understanding and was in agreement with this plan. She also understands that She can call clinic at any time with any questions, concerns, or complaints.    Cancer Staging  Endometrial cancer [facility]) Staging form: Corpus Uteri - Carcinoma and Carcinosarcoma, AJCC 8th Edition - Clinical stage from 04/28/2023: FIGO Stage IVB (cTX, cNX, pM1) - Signed by Jeralyn Ruths, MD on 04/28/2023 Stage prefix: Initial diagnosis   Jeralyn Ruths, MD   07/13/2023 3:47 PM

## 2023-07-14 ENCOUNTER — Inpatient Hospital Stay: Payer: Medicare Other

## 2023-07-14 ENCOUNTER — Ambulatory Visit: Payer: Medicare Other

## 2023-07-14 ENCOUNTER — Inpatient Hospital Stay: Payer: Medicare Other | Admitting: Oncology

## 2023-07-15 ENCOUNTER — Ambulatory Visit: Payer: Medicare Other

## 2023-07-16 ENCOUNTER — Ambulatory Visit: Payer: Medicare Other

## 2023-07-19 ENCOUNTER — Ambulatory Visit: Payer: Medicare Other

## 2023-07-20 ENCOUNTER — Ambulatory Visit: Payer: Medicare Other

## 2023-07-21 ENCOUNTER — Other Ambulatory Visit: Payer: Medicare Other

## 2023-07-21 ENCOUNTER — Ambulatory Visit: Payer: Medicare Other

## 2023-07-21 ENCOUNTER — Ambulatory Visit: Payer: Medicare Other | Admitting: Oncology

## 2023-07-22 ENCOUNTER — Ambulatory Visit: Payer: Medicare Other

## 2023-07-23 ENCOUNTER — Ambulatory Visit: Payer: Medicare Other

## 2023-07-28 ENCOUNTER — Ambulatory Visit: Payer: Medicare Other

## 2023-07-28 ENCOUNTER — Other Ambulatory Visit: Payer: Medicare Other

## 2023-07-28 ENCOUNTER — Ambulatory Visit: Payer: Medicare Other | Admitting: Medical Oncology

## 2023-08-04 ENCOUNTER — Ambulatory Visit: Payer: Medicare Other | Admitting: Oncology

## 2023-08-04 ENCOUNTER — Ambulatory Visit: Payer: Medicare Other

## 2023-08-04 ENCOUNTER — Ambulatory Visit: Payer: Medicare Other | Admitting: Medical Oncology

## 2023-08-04 ENCOUNTER — Other Ambulatory Visit: Payer: Medicare Other

## 2023-08-06 ENCOUNTER — Inpatient Hospital Stay: Payer: Medicare Other | Admitting: Oncology

## 2023-08-06 ENCOUNTER — Inpatient Hospital Stay: Payer: Medicare Other

## 2023-08-06 ENCOUNTER — Inpatient Hospital Stay: Payer: Medicare Other | Admitting: Hospice and Palliative Medicine

## 2023-08-06 ENCOUNTER — Other Ambulatory Visit: Payer: Self-pay | Admitting: Oncology

## 2023-08-11 ENCOUNTER — Ambulatory Visit: Payer: Medicare Other | Admitting: Oncology

## 2023-08-11 ENCOUNTER — Ambulatory Visit: Payer: Medicare Other

## 2023-08-11 ENCOUNTER — Other Ambulatory Visit: Payer: Medicare Other

## 2023-08-18 ENCOUNTER — Ambulatory Visit: Payer: Medicare Other | Admitting: Oncology

## 2023-08-18 ENCOUNTER — Ambulatory Visit: Payer: Medicare Other

## 2023-08-18 ENCOUNTER — Other Ambulatory Visit: Payer: Medicare Other

## 2023-08-22 DEATH — deceased

## 2023-08-25 ENCOUNTER — Ambulatory Visit: Payer: Medicare Other

## 2023-08-25 ENCOUNTER — Ambulatory Visit: Payer: Medicare Other | Admitting: Oncology

## 2023-08-25 ENCOUNTER — Other Ambulatory Visit: Payer: Medicare Other
